# Patient Record
Sex: Male | Born: 1960 | Race: White | Hispanic: No | Marital: Married | Smoking: Never smoker
Health system: Southern US, Community
[De-identification: ages and names within clinical notes are randomized; demographics above are authoritative.]

## PROBLEM LIST (undated history)

## (undated) DIAGNOSIS — E039 Hypothyroidism, unspecified: Secondary | ICD-10-CM

## (undated) DIAGNOSIS — I10 Essential (primary) hypertension: Secondary | ICD-10-CM

## (undated) DIAGNOSIS — G4730 Sleep apnea, unspecified: Secondary | ICD-10-CM

## (undated) DIAGNOSIS — G4733 Obstructive sleep apnea (adult) (pediatric): Secondary | ICD-10-CM

## (undated) DIAGNOSIS — I471 Supraventricular tachycardia, unspecified: Secondary | ICD-10-CM

## (undated) DIAGNOSIS — E118 Type 2 diabetes mellitus with unspecified complications: Secondary | ICD-10-CM

## (undated) DIAGNOSIS — M109 Gout, unspecified: Secondary | ICD-10-CM

## (undated) DIAGNOSIS — E785 Hyperlipidemia, unspecified: Secondary | ICD-10-CM

## (undated) HISTORY — DX: Gout, unspecified: M10.9

## (undated) HISTORY — DX: Obstructive sleep apnea (adult) (pediatric): G47.33

## (undated) HISTORY — DX: Essential (primary) hypertension: I10

## (undated) HISTORY — DX: Hyperlipidemia, unspecified: E78.5

## (undated) HISTORY — DX: Sleep apnea, unspecified: G47.30

## (undated) HISTORY — DX: Hypothyroidism, unspecified: E03.9

## (undated) HISTORY — DX: Supraventricular tachycardia, unspecified: I47.10

## (undated) HISTORY — DX: Type 2 diabetes mellitus with unspecified complications: E11.8

## (undated) HISTORY — DX: Supraventricular tachycardia: I47.1

---
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
## 2007-02-27 ENCOUNTER — Encounter: Admission: RE | Admit: 2007-02-27 | Discharge: 2007-02-27 | Payer: Self-pay | Admitting: Otolaryngology

## 2017-04-22 ENCOUNTER — Other Ambulatory Visit: Payer: Self-pay | Admitting: Family Medicine

## 2017-04-22 DIAGNOSIS — R748 Abnormal levels of other serum enzymes: Secondary | ICD-10-CM

## 2017-04-28 ENCOUNTER — Ambulatory Visit
Admission: RE | Admit: 2017-04-28 | Discharge: 2017-04-28 | Disposition: A | Discharge status: Home / Self Care | Payer: BLUE CROSS/BLUE SHIELD | Source: Ambulatory Visit | Attending: Family Medicine | Admitting: Family Medicine

## 2017-04-28 DIAGNOSIS — R748 Abnormal levels of other serum enzymes: Secondary | ICD-10-CM

## 2017-04-29 ENCOUNTER — Other Ambulatory Visit: Payer: Self-pay | Admitting: Family Medicine

## 2017-04-29 DIAGNOSIS — R932 Abnormal findings on diagnostic imaging of liver and biliary tract: Secondary | ICD-10-CM

## 2017-04-29 DIAGNOSIS — R748 Abnormal levels of other serum enzymes: Secondary | ICD-10-CM

## 2017-05-06 ENCOUNTER — Ambulatory Visit
Admission: RE | Admit: 2017-05-06 | Discharge: 2017-05-06 | Disposition: A | Discharge status: Home / Self Care | Payer: BLUE CROSS/BLUE SHIELD | Source: Ambulatory Visit | Attending: Family Medicine | Admitting: Family Medicine

## 2017-05-06 DIAGNOSIS — R748 Abnormal levels of other serum enzymes: Secondary | ICD-10-CM

## 2017-05-06 DIAGNOSIS — R932 Abnormal findings on diagnostic imaging of liver and biliary tract: Secondary | ICD-10-CM

## 2017-05-06 MED ORDER — IOPAMIDOL (ISOVUE-300) INJECTION 61%
100.0000 mL | Freq: Once | INTRAVENOUS | Status: AC | PRN
  Administered 2017-05-06: 100 mL via INTRAVENOUS

## 2017-05-10 ENCOUNTER — Encounter: Payer: Self-pay | Admitting: Pulmonary Disease

## 2017-05-10 ENCOUNTER — Ambulatory Visit (INDEPENDENT_AMBULATORY_CARE_PROVIDER_SITE_OTHER)
Admission: RE | Admit: 2017-05-10 | Discharge: 2017-05-10 | Disposition: A | Discharge status: Home / Self Care | Payer: BLUE CROSS/BLUE SHIELD | Source: Ambulatory Visit | Attending: Pulmonary Disease | Admitting: Pulmonary Disease

## 2017-05-10 ENCOUNTER — Ambulatory Visit (INDEPENDENT_AMBULATORY_CARE_PROVIDER_SITE_OTHER): Payer: BLUE CROSS/BLUE SHIELD | Admitting: Pulmonary Disease

## 2017-05-10 VITALS — BP 122/78 | HR 66 | Ht 69.0 in | Wt 231.0 lb

## 2017-05-10 DIAGNOSIS — R0600 Dyspnea, unspecified: Secondary | ICD-10-CM

## 2017-05-10 LAB — POCT EXHALED NITRIC OXIDE: FeNO level (ppb): 17

## 2017-05-10 NOTE — Progress Notes (Signed)
Thomas Ballard    [id number]    05-25-1961  Primary Care Physician:Little, Caryn BeeKevin, MD  Referring Physician: Catha GosselinLittle, Kevin, MD [address] [place], [place] [address]  Chief complaint: Consult for evaluation of dyspnea  HPI: 56 year old with history of hypertension, supraventricular tachycardia, diabetes, hypercholesterolemia, sleep apnea, Nash.  Complains of dyspnea on exertion for the past year.  He describes a burning sensation, chest tightness when he exercises or plays tennis.  He denies any symptoms at rest, no wheeze, cough, sputum production, fevers, chills.  He had a cardiac evaluation done by Dr. Jacinto HalimGanji in December 2017 with echocardiogram showing EF 52%, mild concentric LVH.  Nuclear stress was low risk for ischemia. He also had an EKG at his primary care office which was reportedly normal.  He has had spirometry done at his primary care office and was told there were abnormal with mild obstruction.  I do not have these reports to review. He has history of paroxysmal supraventricular tachycardia since teen years.  He is on atenolol for this.  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  Lived in [place] for all his life.  No other significant travel.  Outpatient Encounter Prescriptions as of 05/10/2017  Medication Sig  . albuterol (PROVENTIL HFA;VENTOLIN HFA) 108 (90 Base) MCG/ACT inhaler Inhale 2 puffs into the lungs every 6 (six) hours as needed for wheezing or shortness of breath.  . allopurinol (ZYLOPRIM) 300 MG tablet Take 300 mg by mouth daily.  Marland Kitchen. amLODipine (NORVASC) 10 MG tablet Take 10 mg by mouth daily.  Marland Kitchen. aspirin EC 81 MG tablet Take 81 mg by mouth daily.  Marland Kitchen. atenolol (TENORMIN) 50 MG tablet Take 50 mg by mouth daily.  Marland Kitchen. atorvastatin (LIPITOR) 40 MG tablet Take 40 mg by mouth  daily.  . Canagliflozin-Metformin HCl (INVOKAMET) [phone] MG TABS Take 1 capsule by mouth 2 (two) times daily.  Marland Kitchen. glucosamine-chondroitin 500-400 MG tablet Take 2 tablets by mouth daily.  Marland Kitchen. levothyroxine (SYNTHROID, LEVOTHROID) 112 MCG tablet Take 112 mcg by mouth daily before breakfast.  . meloxicam (MOBIC) 7.5 MG tablet Take 7.5 mg by mouth daily.  . Multiple Vitamin (MULTIVITAMIN) capsule Take 2 capsules by mouth daily.  . Omega-3 Fatty Acids (FISH OIL) 1000 MG CAPS Take 2 capsules by mouth 2 (two) times daily.  . tizanidine (ZANAFLEX) 2 MG capsule Take 2 mg by mouth 3 (three) times daily.  . valsartan-hydrochlorothiazide (DIOVAN-HCT) 160-12.5 MG tablet Take 1 tablet by mouth daily.  . vitamin B-12 (CYANOCOBALAMIN) 1000 MCG tablet Take 5,000 mcg by mouth daily.   No facility-administered encounter medications on file as of 05/10/2017.     Allergies as of 05/10/2017 - Review Complete 05/10/2017  Allergen Reaction Noted  . Erythromycin Nausea And Vomiting 05/10/2017  . Lisinopril Other (See Comments) 05/10/2017    Past Medical History:  Diagnosis Date  . DM (diabetes mellitus), type 2 with complications (HCC)   . Gout   . Hyperlipidemia   . Hypertension   . Hypothyroidism   . OSA (obstructive sleep apnea)   . PSVT (paroxysmal supraventricular tachycardia) (HCC)   . Sleep apnea    Past Surgical History:  Procedure Laterality Date  . BACK SURGERY  1983    Family History  Problem Relation Age of Onset  . COPD Mother   .  Diabetes Mother   . Heart disease Father     Social History   Social History  . Marital status: Married    Spouse name: N/A  . Number of children: N/A  . Years of education: N/A   Occupational History  . Not on file.   Social History Main Topics  . Smoking status: Never Smoker  . Smokeless tobacco: Never Used  . Alcohol use 0.6 oz/week    1 Cans of beer per week     Comment: 1/week  . Drug use: No  . Sexual activity: No   Other Topics  Concern  . Not on file   Social History Narrative  . No narrative on file    Review of systems: Review of Systems  Constitutional: Negative for fever and chills.  HENT: Negative.   Eyes: Negative for blurred vision.  Respiratory: as per HPI  Cardiovascular: Negative for chest pain and palpitations.  Gastrointestinal: Negative for vomiting, diarrhea, blood per rectum. Genitourinary: Negative for dysuria, urgency, frequency and hematuria.  Musculoskeletal: Negative for myalgias, back pain and joint pain.  Skin: Negative for itching and rash.  Neurological: Negative for dizziness, tremors, focal weakness, seizures and loss of consciousness.  Endo/Heme/Allergies: Negative for environmental allergies.  Psychiatric/Behavioral: Negative for depression, suicidal ideas and hallucinations.  All other systems reviewed and are negative.  Physical Exam: Blood pressure 122/78, pulse 66, height 5\' 9"  (1.753 m), weight 231 lb (104.8 kg), SpO2 95 %. Gen:      No acute distress HEENT:  EOMI, sclera anicteric Neck:     No masses; no thyromegaly Lungs:    Clear to auscultation bilaterally; normal respiratory effort CV:         Regular rate and rhythm; no murmurs Abd:      + bowel sounds; soft, non-tender; no palpable masses, no distension Ext:    No edema; adequate peripheral perfusion Skin:      Warm and dry; no rash Neuro: alert and oriented x 3 Psych: normal mood and affect  Data Reviewed: FENO 05/10/17- 17  Assessment:  Consult for evaluation of dyspnea The etiology of his dyspnea is not clear.  There is no suspicion for COPD or asthma We will get a chest x-ray and PFTs for full evaluation Get records from Dr. Clarene Duke and Dr. Consuello Closs regarding his cardiac eval.  If the reason for his dyspnea still unclear then we will get a cardiopulmonary exercise test.  OSA On CPAP since January.  He is stable on therapy.  Plan/Recommendations: - Chest x-ray, PFTs - Obtain medical records  Chilton Greathouse MD [facility] Pager [phone] 05/10/2017, 3:45 PM  CC: Catha Gosselin, MD

## 2017-05-10 NOTE — Patient Instructions (Signed)
We will get chest x-ray and PFTs Return to clinic in 1 week for review and plan for further tests.

## 2017-05-10 NOTE — Progress Notes (Deleted)
         Joylene IgoGeorge Bowersox    [id number]    [birth year]  Primary Care Physician:Little, Caryn BeeKevin, MD  Referring Physician: Catha GosselinLittle, Kevin, MD [address] [place], [place] [address]  Chief complaint: Consult for evaluation of dyspnea  HPI:   Pets: Occupation: Exposures: Smoking history: Travel History:  No outpatient encounter prescriptions on file as of 05/10/2017.   No facility-administered encounter medications on file as of 05/10/2017.     Allergies as of 05/10/2017  . (Not on File)    No past medical history on file.  No past surgical history on file.  No family history on file.  Social History   Social History  . Marital status: Married    Spouse name: N/A  . Number of children: N/A  . Years of education: N/A   Occupational History  . Not on file.   Social History Main Topics  . Smoking status: Not on file  . Smokeless tobacco: Not on file  . Alcohol use Not on file  . Drug use: Unknown  . Sexual activity: Not on file   Other Topics Concern  . Not on file   Social History Narrative  . No narrative on file    Review of systems: Review of Systems  Constitutional: Negative for fever and chills.  HENT: Negative.   Eyes: Negative for blurred vision.  Respiratory: as per HPI  Cardiovascular: Negative for chest pain and palpitations.  Gastrointestinal: Negative for vomiting, diarrhea, blood per rectum. Genitourinary: Negative for dysuria, urgency, frequency and hematuria.  Musculoskeletal: Negative for myalgias, back pain and joint pain.  Skin: Negative for itching and rash.  Neurological: Negative for dizziness, tremors, focal weakness, seizures and loss of consciousness.  Endo/Heme/Allergies: Negative for environmental allergies.  Psychiatric/Behavioral: Negative for depression, suicidal ideas and hallucinations.  All other systems reviewed and are negative.  Physical Exam: There were no vitals taken for this visit. Gen:      No acute  distress HEENT:  EOMI, sclera anicteric Neck:     No masses; no thyromegaly Lungs:    Clear to auscultation bilaterally; normal respiratory effort*** CV:         Regular rate and rhythm; no murmurs Abd:      + bowel sounds; soft, non-tender; no palpable masses, no distension Ext:    No edema; adequate peripheral perfusion Skin:      Warm and dry; no rash Neuro: alert and oriented x 3 Psych: normal mood and affect  Data Reviewed: ***  Assessment:  ***  Plan/Recommendations: ***   Chilton GreathousePraveen Kimaria Struthers MD [facility] Pager [phone] 05/10/2017, 1:26 PM  CC: Catha GosselinLittle, Kevin, MD

## 2017-05-18 ENCOUNTER — Encounter: Payer: Self-pay | Admitting: Pulmonary Disease

## 2017-05-18 ENCOUNTER — Telehealth: Payer: Self-pay

## 2017-05-18 ENCOUNTER — Ambulatory Visit (INDEPENDENT_AMBULATORY_CARE_PROVIDER_SITE_OTHER): Payer: BLUE CROSS/BLUE SHIELD | Admitting: Pulmonary Disease

## 2017-05-18 VITALS — BP 130/78 | HR 69 | Ht 69.5 in | Wt 226.0 lb

## 2017-05-18 DIAGNOSIS — R0600 Dyspnea, unspecified: Secondary | ICD-10-CM | POA: Diagnosis not present

## 2017-05-18 LAB — PULMONARY FUNCTION TEST
DL/VA % pred: 113 %
DL/VA: 5.21 ml/min/mmHg/L
DLCO cor % pred: 107 %
DLCO cor: 33.99 ml/min/mmHg
DLCO unc % pred: 104 %
DLCO unc: 33.1 ml/min/mmHg
FEF 25-75 Post: 2.99 L/sec
FEF 25-75 Pre: 2.57 L/sec
FEF2575-%Change-Post: 16 %
FEF2575-%Pred-Post: 92 %
FEF2575-%Pred-Pre: 79 %
FEV1-%Change-Post: 4 %
FEV1-%Pred-Post: 89 %
FEV1-%Pred-Pre: 85 %
FEV1-Post: 3.36 L
FEV1-Pre: 3.2 L
FEV1FVC-%Change-Post: 0 %
FEV1FVC-%Pred-Pre: 98 %
FEV6-%Change-Post: 5 %
FEV6-%Pred-Post: 95 %
FEV6-%Pred-Pre: 90 %
FEV6-Post: 4.44 L
FEV6-Pre: 4.22 L
FEV6FVC-%Change-Post: 0 %
FEV6FVC-%Pred-Post: 104 %
FEV6FVC-%Pred-Pre: 104 %
FVC-%Change-Post: 4 %
FVC-%Pred-Post: 91 %
FVC-%Pred-Pre: 87 %
FVC-Post: 4.45 L
FVC-Pre: 4.23 L
Post FEV1/FVC ratio: 75 %
Post FEV6/FVC ratio: 100 %
Pre FEV1/FVC ratio: 76 %
Pre FEV6/FVC Ratio: 100 %

## 2017-05-18 NOTE — Progress Notes (Addendum)
Thomas Ballard    [id number]    07/04/1961  Primary Care Physician:Little, Caryn BeeKevin, MD  Referring Physician: Catha GosselinLittle, Kevin, MD [address] [place], [place] [address]  Chief complaint: Follow-up for dyspnea  HPI: 56 year old with history of hypertension, supraventricular tachycardia, diabetes, hypercholesterolemia, sleep apnea, Thomas Boone[place]ash.  Complains of dyspnea on exertion for the past year.  He describes a burning sensation, chest tightness when he exercises or plays tennis.  He denies any symptoms at rest, no wheeze, cough, sputum production, fevers, chills.  He had a cardiac evaluation done by Dr. Jacinto HalimGanji in December 2017 with echocardiogram showing EF 52%, mild concentric LVH.  Nuclear stress was low risk for ischemia. He also had an EKG at his primary care office which was reportedly normal.  He has had spirometry done at his primary care office and was told there were abnormal with mild obstruction.  I do not have these reports to review. He has history of paroxysmal supraventricular tachycardia since teen years.  He is on atenolol for this.   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 [place].  [place]ived in [place] for all his life.  No other significant travel.  Interim history: Respiratory symptoms are unchanged.  He continues to have mild dyspnea on exertion.  He is working on losing weight and reports that this is helping with his breathing.  Outpatient Encounter Medications as of 05/18/2017  Medication Sig  . albuterol (PROVENTIL HFA;VENTOLIN HFA) 108 (90 Base) MCG/ACT inhaler Inhale 2 puffs into the lungs every 6 (six) hours as needed for wheezing or shortness of breath.  . allopurinol (ZYLOPRIM) 300 MG tablet Take 300 mg by mouth daily.  Marland Kitchen. amLODipine (NORVASC) 10 MG tablet Take 10 mg by mouth  daily.  Marland Kitchen. aspirin EC 81 MG tablet Take 81 mg by mouth daily.  Marland Kitchen. atenolol (TENORMIN) 50 MG tablet Take 50 mg by mouth daily.  Marland Kitchen. atorvastatin (LIPITOR) 40 MG tablet Take 40 mg by mouth daily.  . Canagliflozin-Metformin HCl (INVOKAMET) 787 620 6363 MG TABS Take 1 capsule by mouth 2 (two) times daily.  Marland Kitchen. glucosamine-chondroitin 500-400 MG tablet Take 2 tablets by mouth daily.  Marland Kitchen. levothyroxine (SYNTHROID, LEVOTHROID) 112 MCG tablet Take 112 mcg by mouth daily before breakfast.  . Multiple Vitamin (MULTIVITAMIN) capsule Take 2 capsules by mouth daily.  . Omega-3 Fatty Acids (FISH OIL) 1000 MG CAPS Take 2 capsules by mouth 2 (two) times daily.  . valsartan-hydrochlorothiazide (DIOVAN-HCT) 160-12.5 MG tablet Take 1 tablet by mouth daily.  . vitamin B-12 (CYANOCOBALAMIN) 1000 MCG tablet Take 5,000 mcg by mouth daily.  . [DISCONTINUED] meloxicam (MOBIC) 7.5 MG tablet Take 7.5 mg by mouth daily.  . [DISCONTINUED] tizanidine (ZANAFLEX) 2 MG capsule Take 2 mg by mouth 3 (three) times daily.   No facility-administered encounter medications on file as of 05/18/2017.     Allergies as of 05/18/2017 - Review Complete 05/10/2017  Allergen Reaction Noted  . Erythromycin Nausea And Vomiting 05/10/2017  . Lisinopril Other (See Comments) 05/10/2017    Past Medical History:  Diagnosis Date  . DM (diabetes mellitus), type 2 with complications (HCC)   . Gout   . Hyperlipidemia   . Hypertension   . Hypothyroidism   . OSA (obstructive sleep apnea)   . PSVT (paroxysmal supraventricular tachycardia) (HCC)   .  Sleep apnea    Past Surgical History:  Procedure Laterality Date  . BACK SURGERY  1983    Family History  Problem Relation Age of Onset  . COPD Mother   . Diabetes Mother   . Heart disease Father     Social History   Socioeconomic History  . Marital status: Married    Spouse name: Not on file  . Number of children: Not on file  . Years of education: Not on file  . Highest education level: Not  on file  Social Needs  . Financial resource strain: Not on file  . Food insecurity - worry: Not on file  . Food insecurity - inability: Not on file  . Transportation needs - medical: Not on file  . Transportation needs - non-medical: Not on file  Occupational History  . Not on file  Tobacco Use  . Smoking status: Never Smoker  . Smokeless tobacco: Never Used  Substance and Sexual Activity  . Alcohol use: Yes    Alcohol/week: 0.6 oz    Types: 1 Cans of beer per week    Comment: 1/week  . Drug use: No  . Sexual activity: No  Other Topics Concern  . Not on file  Social History Narrative  . Not on file    Review of systems: Review of Systems  Constitutional: Negative for fever and chills.  HENT: Negative.   Eyes: Negative for blurred vision.  Respiratory: as per HPI  Cardiovascular: Negative for chest pain and palpitations.  Gastrointestinal: Negative for vomiting, diarrhea, blood per rectum. Genitourinary: Negative for dysuria, urgency, frequency and hematuria.  Musculoskeletal: Negative for myalgias, back pain and joint pain.  Skin: Negative for itching and rash.  Neurological: Negative for dizziness, tremors, focal weakness, seizures and loss of consciousness.  Endo/Heme/Allergies: Negative for environmental allergies.  Psychiatric/Behavioral: Negative for depression, suicidal ideas and hallucinations.  All other systems reviewed and are negative.  Physical Exam: Blood pressure 130/78, pulse 69, height 5' 9.5" (1.765 m), weight 226 lb (102.5 kg), SpO2 95 %. Gen:      No acute distress HEENT:  EOMI, sclera anicteric Neck:     No masses; no thyromegaly Lungs:    Clear to auscultation bilaterally; normal respiratory effort CV:         Regular rate and rhythm; no murmurs Abd:      + bowel sounds; soft, non-tender; no palpable masses, no distension Ext:    No edema; adequate peripheral perfusion Skin:      Warm and dry; no rash Neuro: alert and oriented x 3 Psych: normal  mood and affect  Data Reviewed: FENO 05/10/17- 17  Chest x-ray 05/10/17-no active cardiopulmonary disease.  CT abdomen 05/06/17-fatty liver, coronary artery calcification, lung images show no acute lung abnormality. I have reviewed the images personally  PFTs 05/18/17- FVC 4.45 [91%], FEV1 3.36 [89%], F/F 75, DLCO 104% Minimal obstruction shown by curvature of the flow loops.  Normal diffusion capacity.  Assessment:  Consult for evaluation of dyspnea The etiology of his dyspnea is not clear.  There is no suspicion for COPD or asthma Chest x-ray and CT abdomen reviewed.  There is no obvious lung abnormality PFTs show very minimal obstruction. I suspect his symptoms are secondary to deconditioning and body habitus.  He will continue to work on losing weight and diet, exercise Reevaluate in 6 months.  If there is any worsening of his symptoms then we can consider cardiopulmonary exercise test  OSA On CPAP since January.  He is stable on therapy.  Plan/Recommendations: - Chest x-ray, PFTs - Obtain medical records  Chilton GreathousePraveen Iyonnah Ferrante MD [facility] Pager [phone] 05/18/2017, 10:44 AM  CC: Catha GosselinLittle, Kevin, MD   Addendum: Received any notes from Dr. Jacinto HalimGanji [facility] visit 06/21/16 evaluation for atypical chest pain  Exercise stress test 06/14/16- low risk study Echocardiogram 06/15/16- LV cavity is normal size, mild concentric LVH.  Normal diastolic filling pattern.  Calculated EF 53% Left atrial cavity is mildly dilated, right atrial cavity is mildly dilated Right ventricle is mildly dilated with normal RV function.  Trace TR.  Unable to estimate PA pressure due to minimal TR  Advised diet, exercise and follow-up as needed.  Chilton GreathousePraveen Earlena Werst MD [facility] Pager [phone] If no answer or after 3pm call: [phone] 05/31/2017, 4:51 PM

## 2017-05-18 NOTE — Progress Notes (Signed)
PFT done today by Lindsay Lemons, CMA  

## 2017-05-18 NOTE — Telephone Encounter (Signed)
Received records from Dr. Fredirick MaudlinLittle's office. Records have been placed in PM's cubby for review.

## 2017-05-18 NOTE — Telephone Encounter (Signed)
Records release has been faxed to Dr. Jacinto HalimGanji and Dr. Fredirick MaudlinLittle's office requesting records. Will await records.

## 2017-05-18 NOTE — Patient Instructions (Signed)
I have reviewed your chest x-ray, CT abdomen and pulmonary function test.  There is no significant abnormality in your lungs.  Please continue to work on losing weight and exercise Continue using the CPAP Follow-up in 6 months.  Please call sooner if there is any change or worsening of your symptoms.

## 2017-05-30 NOTE — Telephone Encounter (Signed)
Records have been received from Dr. Jacinto HalimGanji and placed to PM's cubby for review. Nothing further needed.

## 2017-05-31 ENCOUNTER — Telehealth: Payer: Self-pay | Admitting: Pulmonary Disease

## 2017-05-31 NOTE — Telephone Encounter (Signed)
[facility] [employer] forward 43 pages to Dr. Chilton GreathousePraveen Mannam

## 2019-05-15 ENCOUNTER — Other Ambulatory Visit: Payer: Self-pay

## 2019-05-15 DIAGNOSIS — Z20822 Contact with and (suspected) exposure to covid-19: Secondary | ICD-10-CM

## 2019-05-17 LAB — NOVEL CORONAVIRUS, NAA: SARS-CoV-2, NAA: NOT DETECTED

## 2019-07-02 IMAGING — CT CT ABDOMEN WO/W CM
4 of 11 series · 15 of 36 positions shown, 19 images · IV contrast (iopamidol)
Comparison: None.

CLINICAL DATA: Elevated liver enzymes.

EXAM:
CT ABDOMEN WITHOUT AND WITH CONTRAST
TECHNIQUE: Multidetector CT imaging of the abdomen was performed following the
standard protocol before and following the bolus administration of
intravenous contrast.
CONTRAST:  100mL [id number]-[id number] IOPAMIDOL ([id number]) INJECTION 61%

[Series 6: arterial (id) · axial · arterial · 0.94mm/px · z∈[-226,-72]mm · 3 of 124 slices shown]
[im 31/124  soft-tissue]
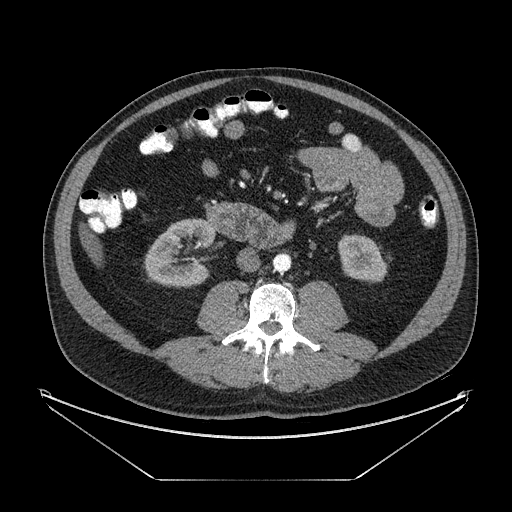
[im 62/124  soft-tissue]
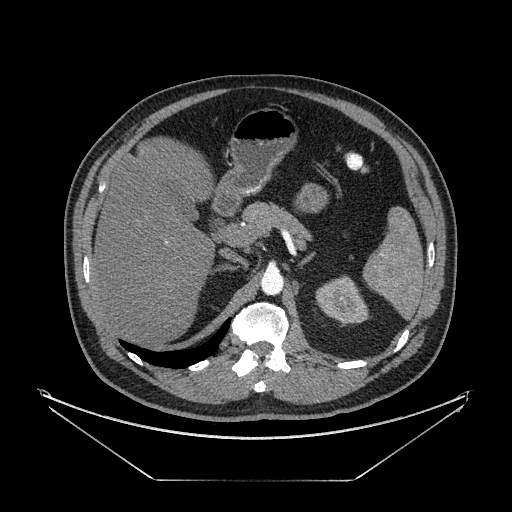
[im 93/124  soft-tissue]
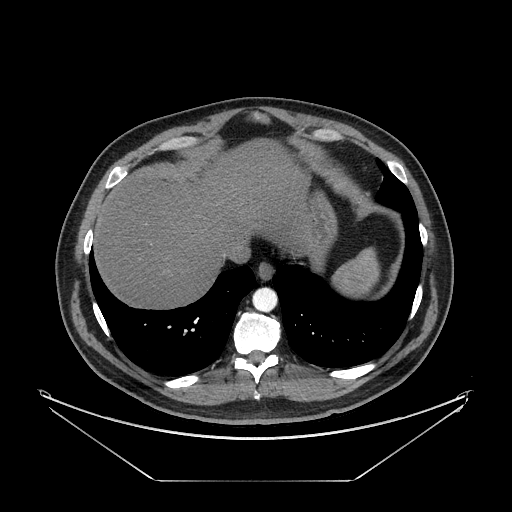

[Series 9: portal venous (id) · axial · portal-venous · 0.94mm/px · z∈[-226,-72]mm · 3 of 124 slices shown, 7 images]
[im 31/124  soft-tissue]
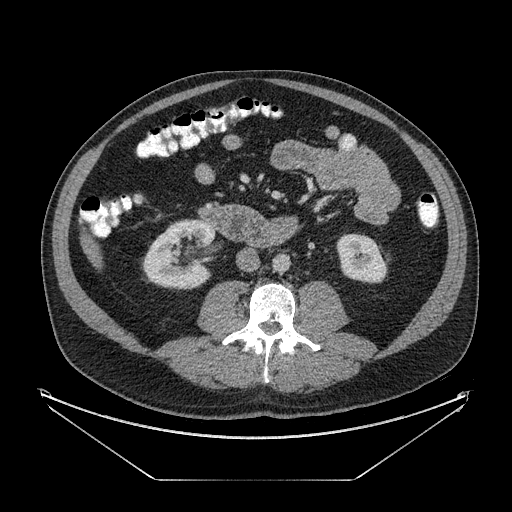
[im 31/124  lung]
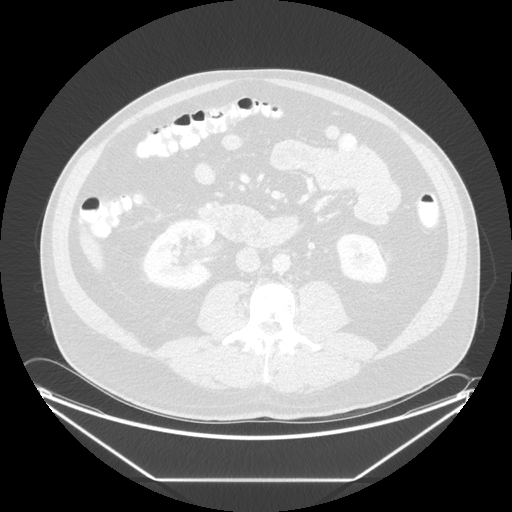
[im 31/124  bone]
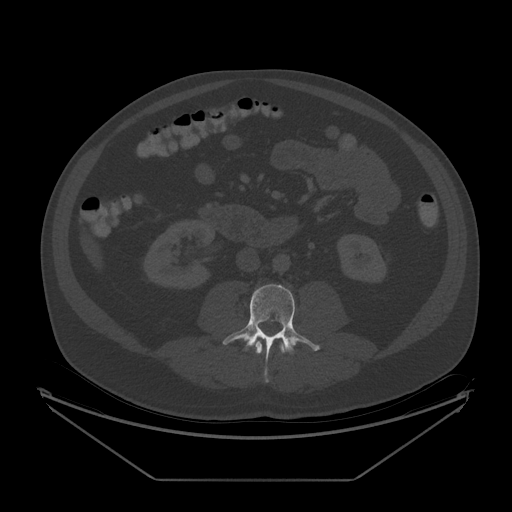
[im 62/124  soft-tissue]
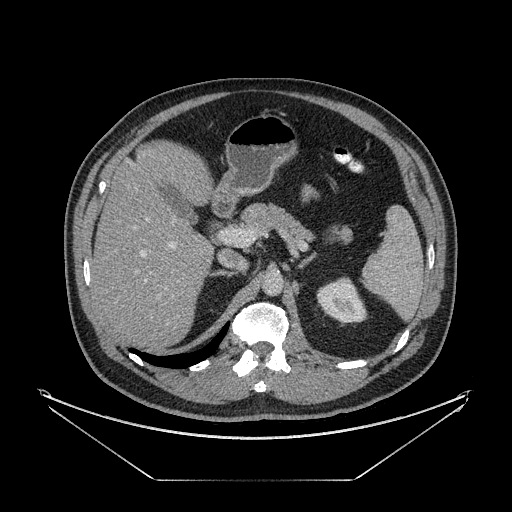
[im 62/124  lung]
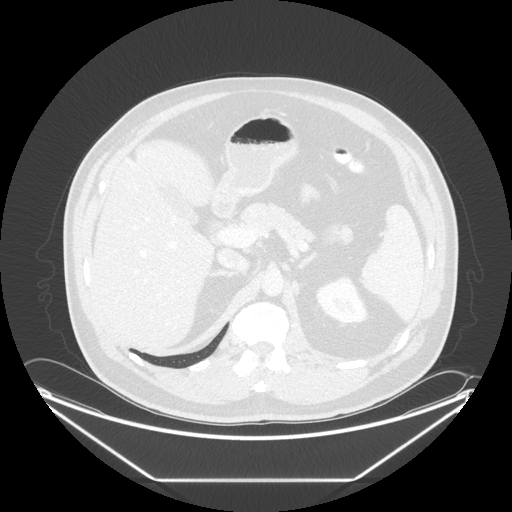
[im 93/124  soft-tissue]
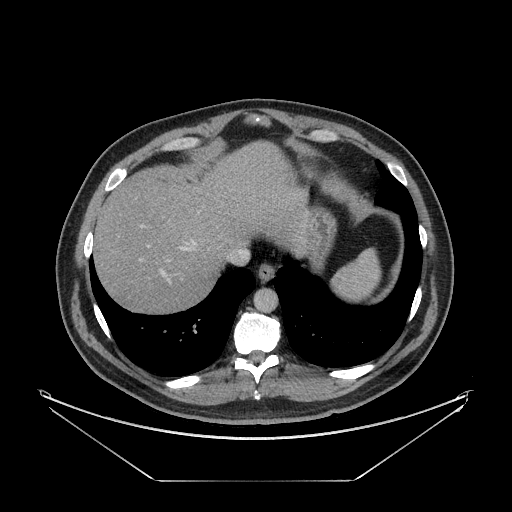
[im 93/124  lung]
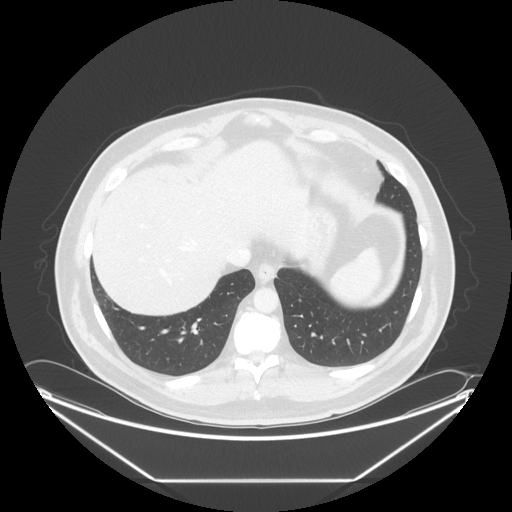

[Series 602: sagittal body · sagittal · 0.94mm/px · 5 of 168 slices shown (1 of 2)]
[im 28/168  soft-tissue]
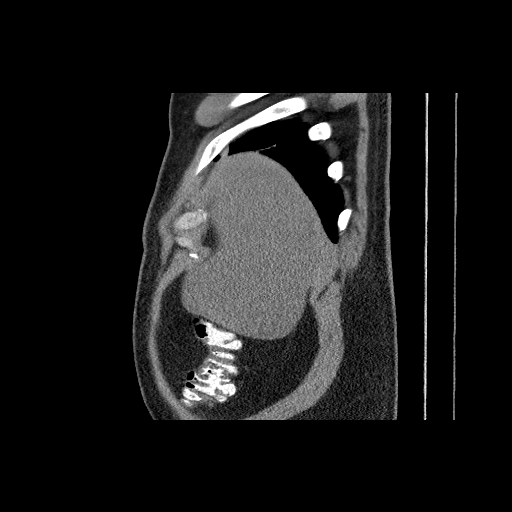
[im 56/168  soft-tissue]
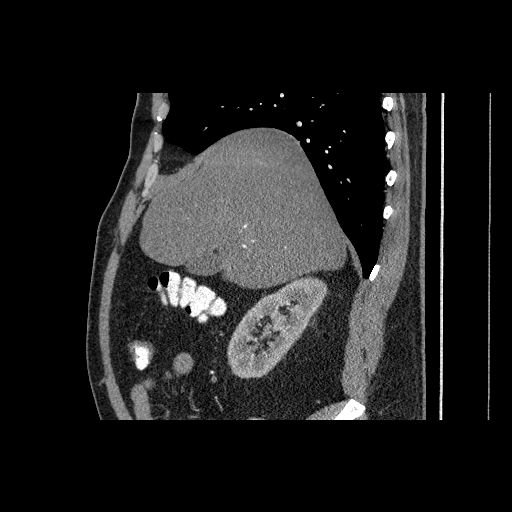
[im 84/168  soft-tissue]
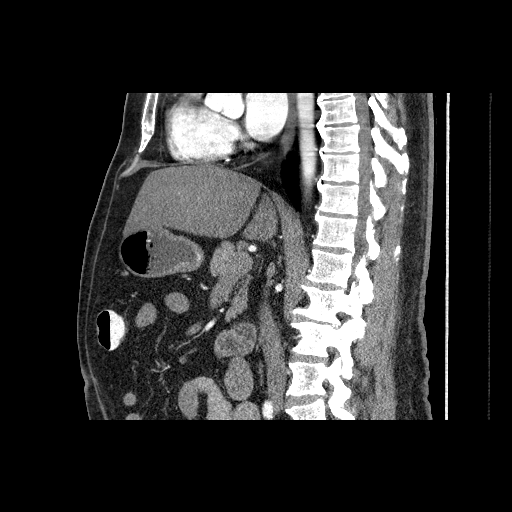
[im 112/168  soft-tissue]
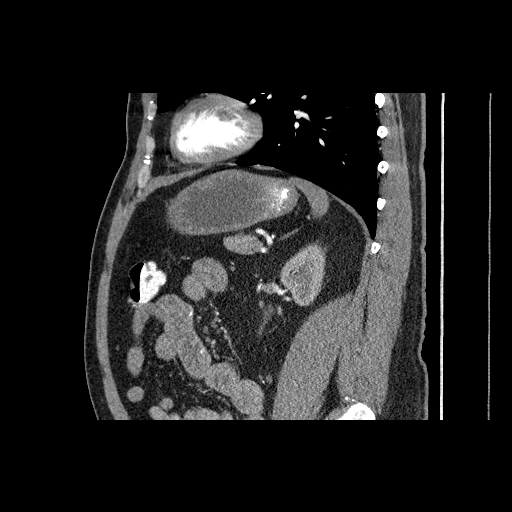
[im 140/168  soft-tissue]
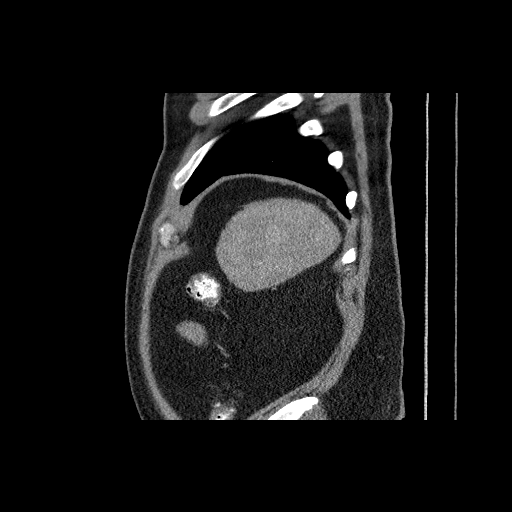

[Series 605: sagittal body · sagittal · 0.94mm/px · 4 of 169 slices shown (2 of 2)]
[im 29/169  soft-tissue]
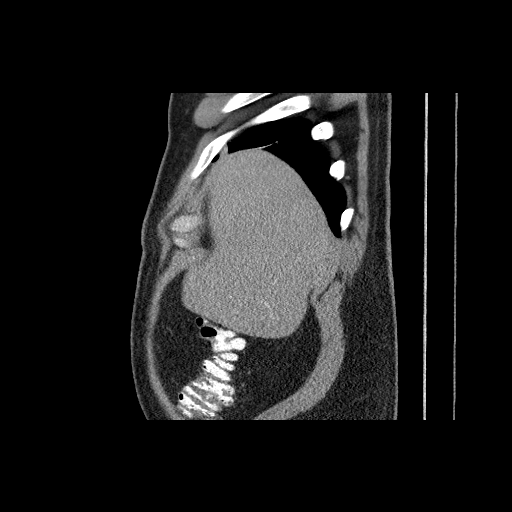
[im 57/169  soft-tissue]
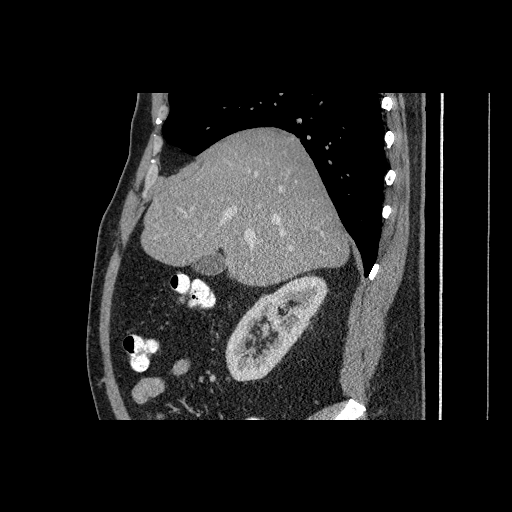
[im 85/169  soft-tissue]
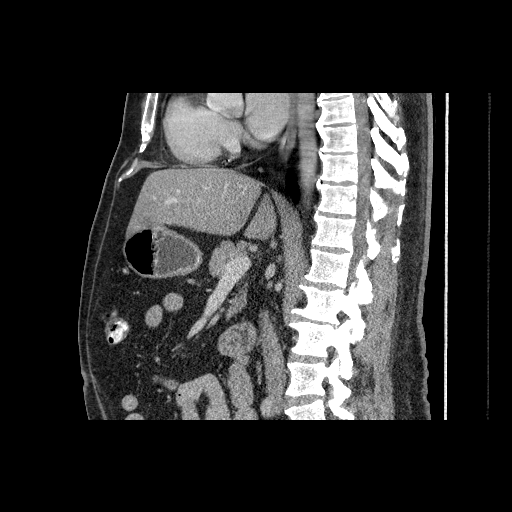
[im 113/169  soft-tissue]
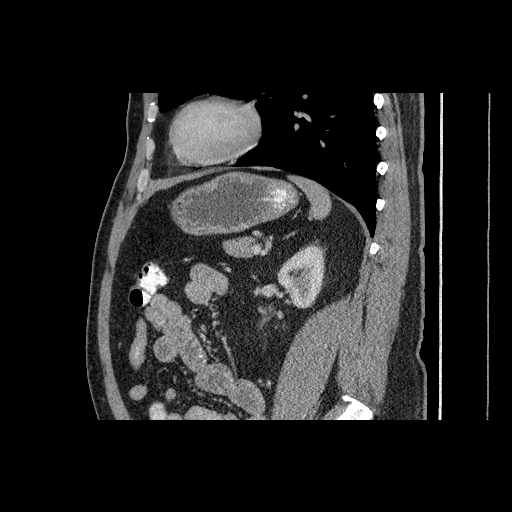

[15 of 36 positions shown; findings below may reference images not displayed]

FINDINGS: Lower chest: The lung bases are clear of acute process. No pleural
effusion or pulmonary lesions. The heart is normal in size. No
pericardial effusion. Age advanced three-vessel coronary artery
calcifications are noted. The distal esophagus and aorta are
unremarkable.

Hepatobiliary: Diffuse fatty infiltration of the liver but no focal
hepatic lesions or intrahepatic biliary dilatation. The portal and
hepatic veins are normal. The gallbladder is normal. No common bile
duct dilatation.

Pancreas: No mass, inflammation or ductal dilatation.

Spleen: Normal size.  No focal lesions.

Adrenals/Urinary Tract: The adrenal glands and kidneys are normal.

Stomach/Bowel: The stomach, duodenum, visualized small bowel and
visualize colon are unremarkable. The terminal ileum and visualized
appendix are normal.

Vascular/Lymphatic: Scattered aortic calcifications but no aneurysm
or dissection. The branch vessels are patent. The major venous
structures are patent.

No mesenteric or retroperitoneal mass or adenopathy.

Other:  No ascites or abdominal wall hernia.

Musculoskeletal: No significant bony findings. The bilateral SI
joint degenerative changes are noted.
IMPRESSION: 1. Diffuse fatty infiltration of the liver but no focal hepatic
lesions or biliary dilatation.
2. No acute abdominal findings, mass lesions or adenopathy.
3. Significant age advanced three-vessel coronary artery
calcifications.

## 2019-07-06 IMAGING — DX DG CHEST 2V
2 series · 2 of 2 positions shown · non-contrast
Comparison: None in PACs

CLINICAL DATA: Two months of shortness of breath. History of
diabetes, hypertension, tachycardia.

EXAM:
CHEST  2 VIEW

[chest pa]
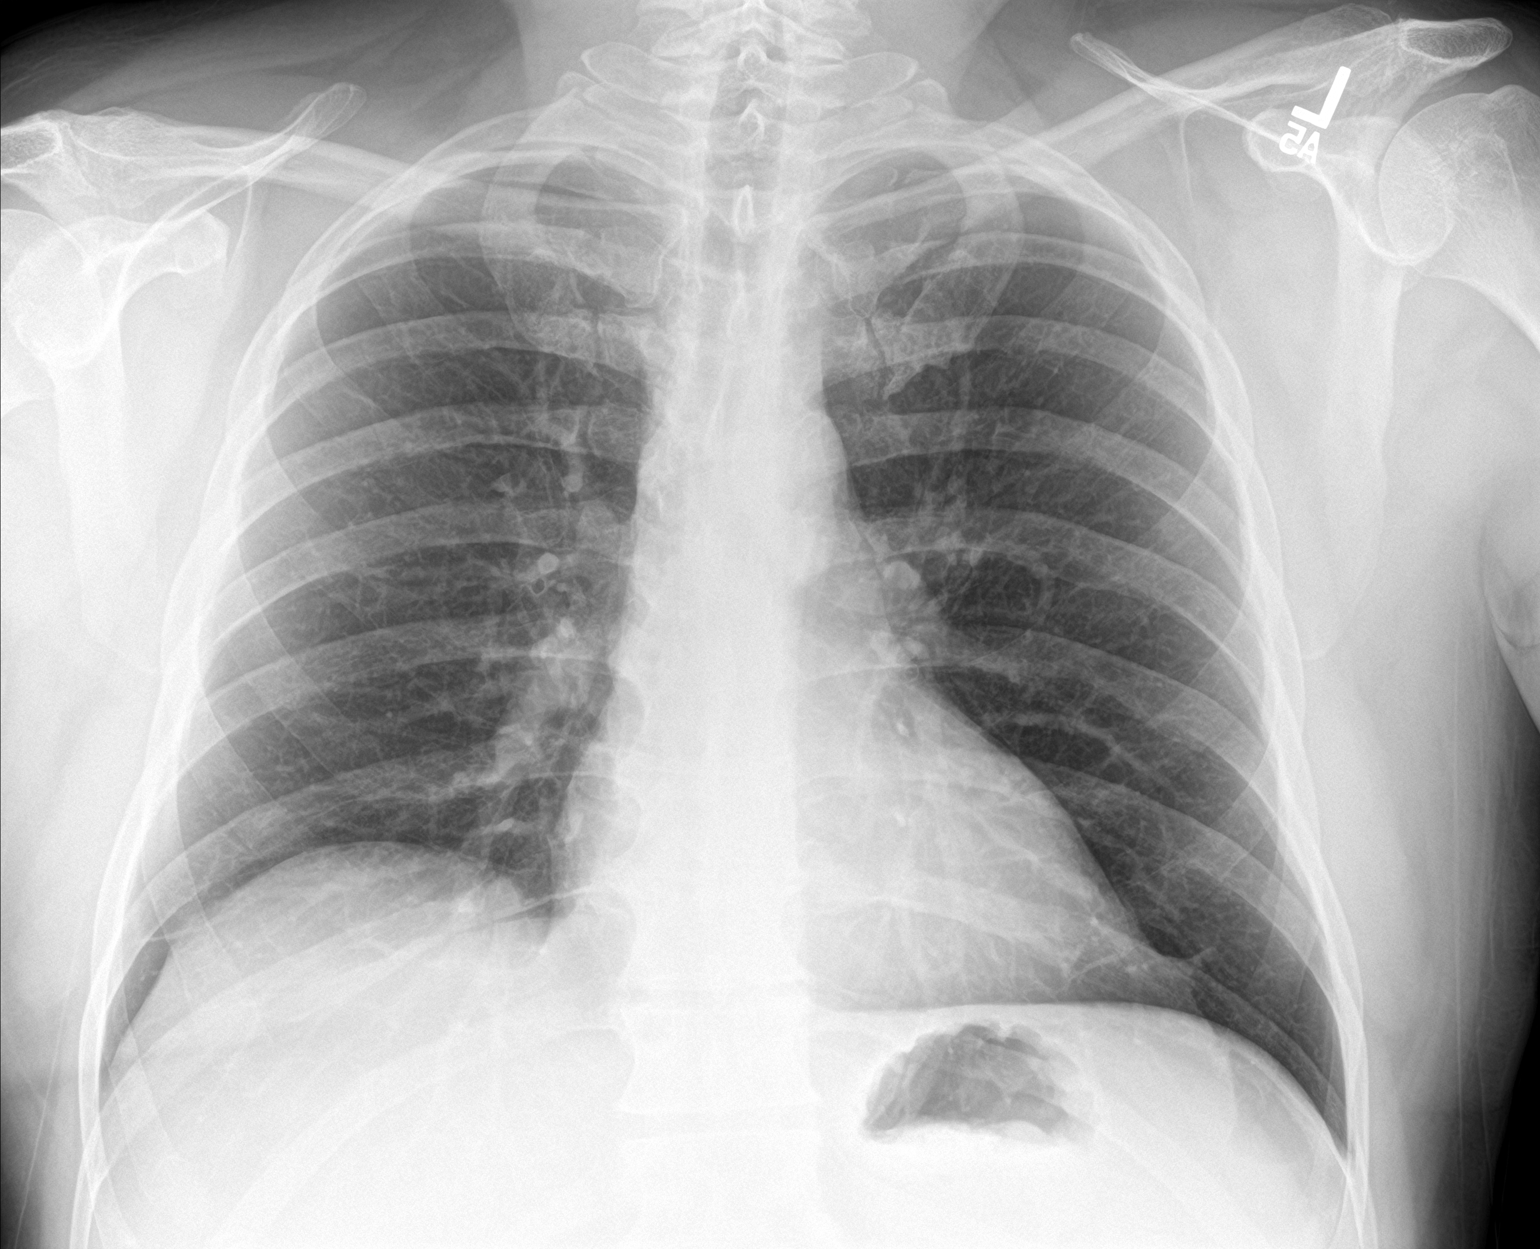

[chest lat]
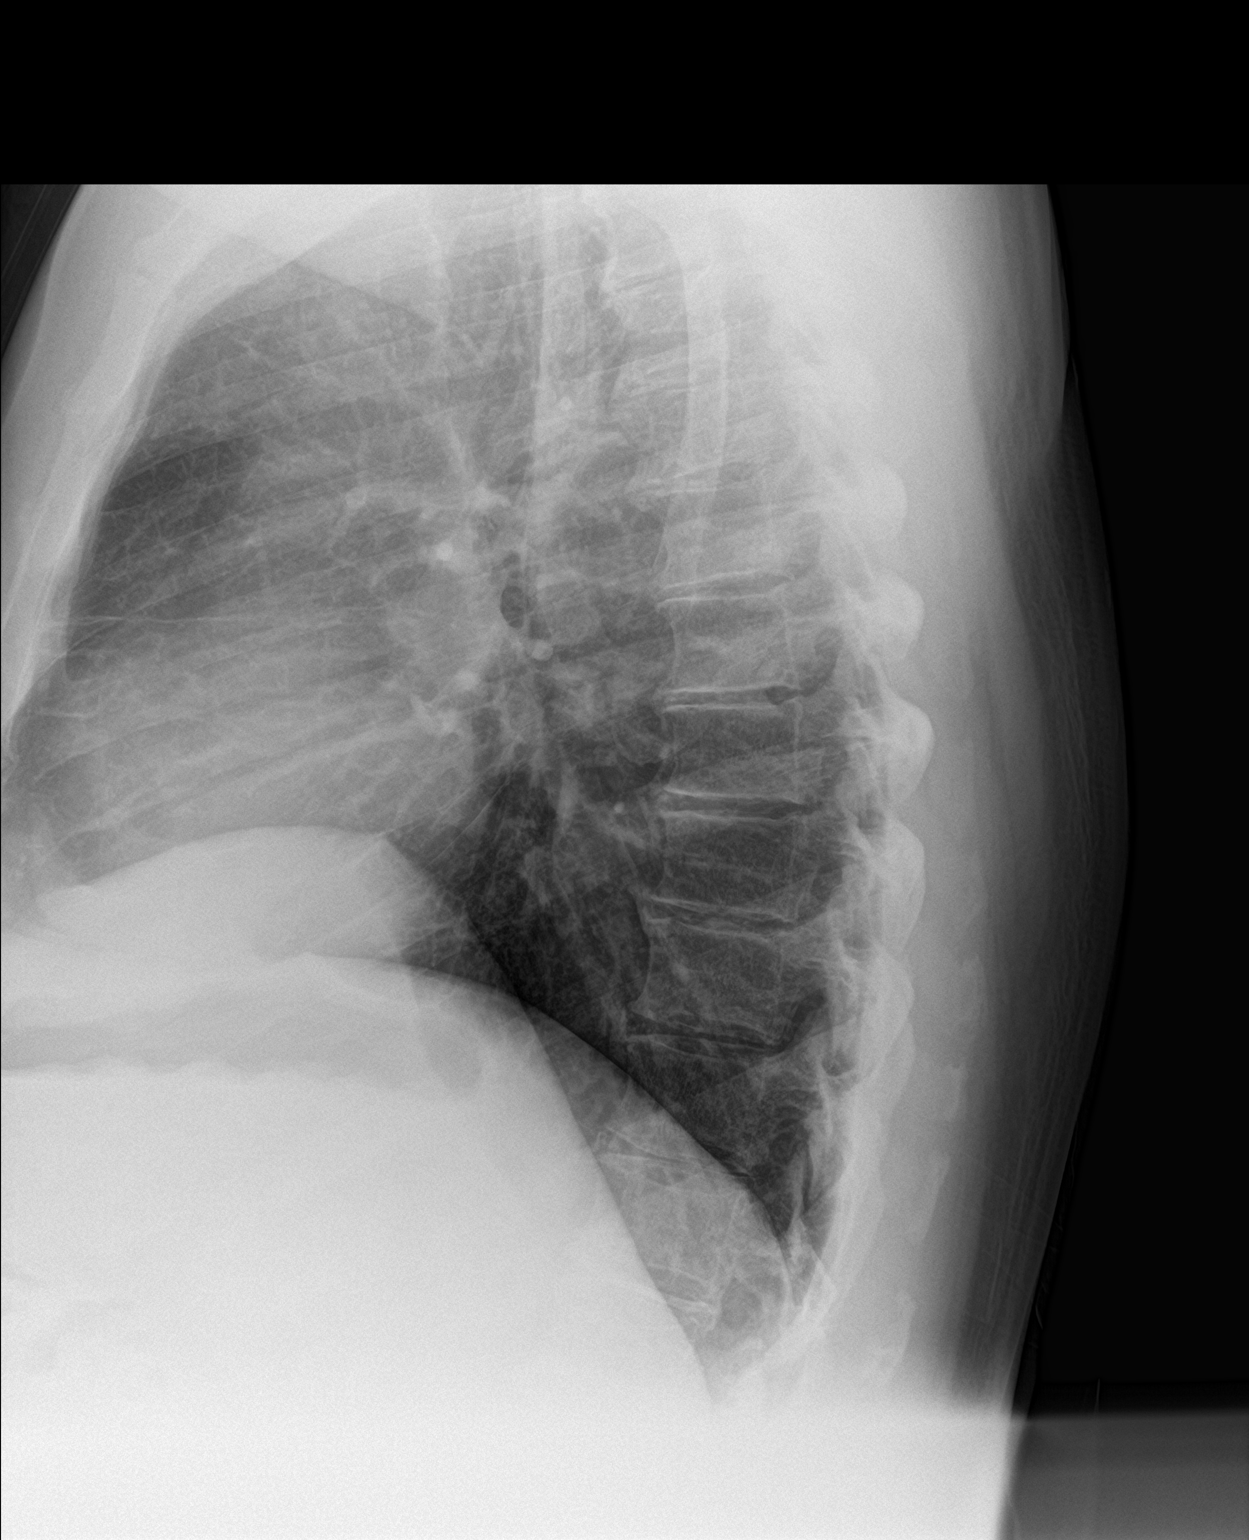

[2 of 2 positions shown; findings below may reference images not displayed]

FINDINGS: The lungs are adequately inflated and clear. The heart and pulmonary
vascularity are normal. The mediastinum is normal in width. There is
no pleural effusion. The bony thorax exhibits no acute abnormality.
IMPRESSION: There is no active cardiopulmonary disease.

## 2019-09-28 ENCOUNTER — Ambulatory Visit: Payer: Self-pay | Attending: Internal Medicine

## 2019-09-28 DIAGNOSIS — Z23 Encounter for immunization: Secondary | ICD-10-CM

## 2019-09-28 NOTE — Progress Notes (Signed)
   Covid-19 Vaccination Clinic  Name:  Thomas Ballard    MRN: [id number] DOB: 02-24-1961  09/28/2019  [name] was observed post Covid-19 immunization for 15 minutes without incident. He was provided with Vaccine Information Sheet and instruction to access the V-Safe system.   [name] was instructed to call 911 with any severe reactions post vaccine: Marland Kitchen Difficulty breathing  . Swelling of face and throat  . A fast heartbeat  . A bad rash all over body  . Dizziness and weakness   Immunizations Administered    Name Date Dose VIS Date Route   Pfizer COVID-19 Vaccine 09/28/2019 10:28 AM 0.3 mL 06/22/2019 Intramuscular   Manufacturer: [employer], [employer]   Lot: [id number]   NDC: [address]-6

## 2019-10-24 ENCOUNTER — Ambulatory Visit: Payer: Self-pay | Attending: Internal Medicine

## 2019-10-24 DIAGNOSIS — Z23 Encounter for immunization: Secondary | ICD-10-CM

## 2019-10-24 NOTE — Progress Notes (Signed)
   Covid-19 Vaccination Clinic  Name:  Thomas Ballard    MRN: [id number] DOB: 1960-09-29  10/24/2019  [name] was observed post Covid-19 immunization for 15 minutes without incident. He was provided with Vaccine Information Sheet and instruction to access the V-Safe system.   [name] was instructed to call 911 with any severe reactions post vaccine: Marland Kitchen Difficulty breathing  . Swelling of face and throat  . A fast heartbeat  . A bad rash all over body  . Dizziness and weakness   Immunizations Administered    Name Date Dose VIS Date Route   Pfizer COVID-19 Vaccine 10/24/2019  8:22 AM 0.3 mL 06/22/2019 Intramuscular   Manufacturer: [employer], [employer]   Lot: [id number]   NDC: [address]-0

## 2021-07-16 DIAGNOSIS — L812 Freckles: Secondary | ICD-10-CM | POA: Diagnosis not present

## 2021-07-16 DIAGNOSIS — D225 Melanocytic nevi of trunk: Secondary | ICD-10-CM | POA: Diagnosis not present

## 2021-07-16 DIAGNOSIS — L819 Disorder of pigmentation, unspecified: Secondary | ICD-10-CM | POA: Diagnosis not present

## 2021-07-16 DIAGNOSIS — L570 Actinic keratosis: Secondary | ICD-10-CM | POA: Diagnosis not present

## 2021-07-16 DIAGNOSIS — L814 Other melanin hyperpigmentation: Secondary | ICD-10-CM | POA: Diagnosis not present

## 2021-07-16 DIAGNOSIS — L821 Other seborrheic keratosis: Secondary | ICD-10-CM | POA: Diagnosis not present

## 2021-07-16 DIAGNOSIS — L853 Xerosis cutis: Secondary | ICD-10-CM | POA: Diagnosis not present

## 2021-08-14 DIAGNOSIS — Z0000 Encounter for general adult medical examination without abnormal findings: Secondary | ICD-10-CM | POA: Diagnosis not present

## 2021-08-14 DIAGNOSIS — K760 Fatty (change of) liver, not elsewhere classified: Secondary | ICD-10-CM | POA: Diagnosis not present

## 2021-08-14 DIAGNOSIS — H9312 Tinnitus, left ear: Secondary | ICD-10-CM | POA: Diagnosis not present

## 2021-08-14 DIAGNOSIS — E039 Hypothyroidism, unspecified: Secondary | ICD-10-CM | POA: Diagnosis not present

## 2021-08-14 DIAGNOSIS — E782 Mixed hyperlipidemia: Secondary | ICD-10-CM | POA: Diagnosis not present

## 2021-08-14 DIAGNOSIS — I10 Essential (primary) hypertension: Secondary | ICD-10-CM | POA: Diagnosis not present

## 2021-08-14 DIAGNOSIS — E119 Type 2 diabetes mellitus without complications: Secondary | ICD-10-CM | POA: Diagnosis not present

## 2021-08-14 DIAGNOSIS — Z125 Encounter for screening for malignant neoplasm of prostate: Secondary | ICD-10-CM | POA: Diagnosis not present

## 2021-08-14 DIAGNOSIS — M109 Gout, unspecified: Secondary | ICD-10-CM | POA: Diagnosis not present

## 2021-11-02 DIAGNOSIS — G4733 Obstructive sleep apnea (adult) (pediatric): Secondary | ICD-10-CM | POA: Diagnosis not present

## 2021-12-21 DIAGNOSIS — E1165 Type 2 diabetes mellitus with hyperglycemia: Secondary | ICD-10-CM | POA: Diagnosis not present

## 2021-12-21 DIAGNOSIS — E039 Hypothyroidism, unspecified: Secondary | ICD-10-CM | POA: Diagnosis not present

## 2021-12-21 DIAGNOSIS — E7800 Pure hypercholesterolemia, unspecified: Secondary | ICD-10-CM | POA: Diagnosis not present

## 2021-12-29 DIAGNOSIS — E1165 Type 2 diabetes mellitus with hyperglycemia: Secondary | ICD-10-CM | POA: Diagnosis not present

## 2021-12-29 DIAGNOSIS — E7800 Pure hypercholesterolemia, unspecified: Secondary | ICD-10-CM | POA: Diagnosis not present

## 2021-12-29 DIAGNOSIS — I10 Essential (primary) hypertension: Secondary | ICD-10-CM | POA: Diagnosis not present

## 2021-12-29 DIAGNOSIS — G629 Polyneuropathy, unspecified: Secondary | ICD-10-CM | POA: Diagnosis not present

## 2021-12-29 DIAGNOSIS — E039 Hypothyroidism, unspecified: Secondary | ICD-10-CM | POA: Diagnosis not present

## 2021-12-29 DIAGNOSIS — E559 Vitamin D deficiency, unspecified: Secondary | ICD-10-CM | POA: Diagnosis not present

## 2023-06-23 ENCOUNTER — Ambulatory Visit: Payer: 59 | Admitting: Podiatry

## 2023-06-23 ENCOUNTER — Ambulatory Visit (INDEPENDENT_AMBULATORY_CARE_PROVIDER_SITE_OTHER): Payer: 59

## 2023-06-23 DIAGNOSIS — M79671 Pain in right foot: Secondary | ICD-10-CM

## 2023-06-23 DIAGNOSIS — G629 Polyneuropathy, unspecified: Secondary | ICD-10-CM

## 2023-06-23 DIAGNOSIS — M7662 Achilles tendinitis, left leg: Secondary | ICD-10-CM | POA: Diagnosis not present

## 2023-06-23 DIAGNOSIS — M79672 Pain in left foot: Secondary | ICD-10-CM | POA: Diagnosis not present

## 2023-06-23 NOTE — Patient Instructions (Signed)
VISIT SUMMARY:  During today's visit, we discussed several ongoing health concerns, including sensations in your feet, tenderness in your left Achilles tendon, and occasional instability in your right foot. We also reviewed your history of back surgery and diabetes management. We have developed a plan to address each of these issues and maintain your overall health.  YOUR PLAN:  -PERIPHERAL NEUROPATHY: Peripheral neuropathy is a condition where the nerves in your extremities, like your feet, are damaged, often causing sensations like 'bubble wrap.' This can be due to diabetes or past back surgery. We recommend trying the [facility] supplement for symptom relief, maintaining good blood sugar control, and performing regular foot checks to prevent unnoticed injuries or infections.  -ACHILLES TENDINITIS: Achilles tendinitis is inflammation of the Achilles tendon, causing tenderness and swelling. We suggest a home therapy plan with stretching exercises twice daily for a month, continuing Advil for inflammation, and avoiding high-intensity activities. If there's no improvement in a month, we may refer you to physical therapy or consider using a boot.  -FUNCTIONAL ANKLE INSTABILITY: Functional ankle instability is when the ankle feels like it might give way, often due to muscle weakness. We will continue with the Achilles therapy plan, which may also help improve ankle stability. If symptoms worsen or become more consistent, further evaluation may be needed.  -GENERAL HEALTH MAINTENANCE: To manage your chronic back issues, continue using good chair and car seat ergonomics. Maintain good blood sugar control for diabetes management and perform regular foot checks to prevent unnoticed injuries or infections.  INSTRUCTIONS:  Please follow the home therapy plan for your Achilles tendinitis, including stretching exercises twice daily and taking Advil as needed. Avoid high-intensity activities for a few more weeks.  If there is no improvement in a month, we will consider referring you to physical therapy or using a boot. Continue with regular foot checks and maintain good blood sugar control. If you experience worsening symptoms or new issues, please schedule a follow-up appointment.    Achilles Tendinitis  with Rehab Achilles tendinitis is a disorder of the Achilles tendon. The Achilles tendon connects the large calf muscles (Gastrocnemius and Soleus) to the heel bone (calcaneus). This tendon is sometimes called the heel cord. It is important for pushing-off and standing on your toes and is important for walking, running, or jumping. Tendinitis is often caused by overuse and repetitive microtrauma. SYMPTOMS Pain, tenderness, swelling, warmth, and redness may occur over the Achilles tendon even at rest. Pain with pushing off, or flexing or extending the ankle. Pain that is worsened after or during activity. CAUSES  Overuse sometimes seen with rapid increase in exercise programs or in sports requiring running and jumping. Poor physical conditioning (strength and flexibility or endurance). Running sports, especially training running down hills. Inadequate warm-up before practice or play or failure to stretch before participation. Injury to the tendon. PREVENTION  Warm up and stretch before practice or competition. Allow time for adequate rest and recovery between practices and competition. Keep up conditioning. Keep up ankle and leg flexibility. Improve or keep muscle strength and endurance. Improve cardiovascular fitness. Use proper technique. Use proper equipment (shoes, skates). To help prevent recurrence, taping, protective strapping, or an adhesive bandage may be recommended for several weeks after healing is complete. PROGNOSIS  Recovery may take weeks to several months to heal. Longer recovery is expected if symptoms have been prolonged. Recovery is usually quicker if the inflammation is due  to a direct blow as compared with overuse or sudden strain. RELATED  COMPLICATIONS  Healing time will be prolonged if the condition is not correctly treated. The injury must be given plenty of time to heal. Symptoms can reoccur if activity is resumed too soon. Untreated, tendinitis may increase the risk of tendon rupture requiring additional time for recovery and possibly surgery. TREATMENT  The first treatment consists of rest anti-inflammatory medication, and ice to relieve the pain. Stretching and strengthening exercises after resolution of pain will likely help reduce the risk of recurrence. Referral to a physical therapist or athletic trainer for further evaluation and treatment may be helpful. A walking boot or cast may be recommended to rest the Achilles tendon. This can help break the cycle of inflammation and microtrauma. Arch supports (orthotics) may be prescribed or recommended by your caregiver as an adjunct to therapy and rest. Surgery to remove the inflamed tendon lining or degenerated tendon tissue is rarely necessary and has shown less than predictable results. MEDICATION  Nonsteroidal anti-inflammatory medications, such as aspirin and ibuprofen, may be used for pain and inflammation relief. Do not take within 7 days before surgery. Take these as directed by your caregiver. Contact your caregiver immediately if any bleeding, stomach upset, or signs of allergic reaction occur. Other minor pain relievers, such as acetaminophen, may also be used. Pain relievers may be prescribed as necessary by your caregiver. Do not take prescription pain medication for longer than 4 to 7 days. Use only as directed and only as much as you need. Cortisone injections are rarely indicated. Cortisone injections may weaken tendons and predispose to rupture. It is better to give the condition more time to heal than to use them. HEAT AND COLD Cold is used to relieve pain and reduce inflammation for acute and  chronic Achilles tendinitis. Cold should be applied for 10 to 15 minutes every 2 to 3 hours for inflammation and pain and immediately after any activity that aggravates your symptoms. Use ice packs or an ice massage. Heat may be used before performing stretching and strengthening activities prescribed by your caregiver. Use a heat pack or a warm soak. SEEK MEDICAL CARE IF: Symptoms get worse or do not improve in 2 weeks despite treatment. New, unexplained symptoms develop. Drugs used in treatment may produce side effects.  EXERCISES:  RANGE OF MOTION (ROM) AND STRETCHING EXERCISES - Achilles Tendinitis  These exercises may help you when beginning to rehabilitate your injury. Your symptoms may resolve with or without further involvement from your physician, physical therapist or athletic trainer. While completing these exercises, remember:  Restoring tissue flexibility helps normal motion to return to the joints. This allows healthier, less painful movement and activity. An effective stretch should be held for at least 30 seconds. A stretch should never be painful. You should only feel a gentle lengthening or release in the stretched tissue.  STRETCH  Gastroc, Standing  Place hands on wall. Extend right / left leg, keeping the front knee somewhat bent. Slightly point your toes inward on your back foot. Keeping your right / left heel on the floor and your knee straight, shift your weight toward the wall, not allowing your back to arch. You should feel a gentle stretch in the right / left calf. Hold this position for 10 seconds. Repeat 3 times. Complete this stretch 2 times per day.  STRETCH  Soleus, Standing  Place hands on wall. Extend right / left leg, keeping the other knee somewhat bent. Slightly point your toes inward on your back foot. Keep your right / left  heel on the floor, bend your back knee, and slightly shift your weight over the back leg so that you feel a gentle stretch deep in  your back calf. Hold this position for 10 seconds. Repeat 3 times. Complete this stretch 2 times per day.  STRETCH  Gastrocsoleus, Standing  Note: This exercise can place a lot of stress on your foot and ankle. Please complete this exercise only if specifically instructed by your caregiver.  Place the ball of your right / left foot on a step, keeping your other foot firmly on the same step. Hold on to the wall or a rail for balance. Slowly lift your other foot, allowing your body weight to press your heel down over the edge of the step. You should feel a stretch in your right / left calf. Hold this position for 10 seconds. Repeat this exercise with a slight bend in your knee. Repeat 3 times. Complete this stretch 2 times per day.   STRENGTHENING EXERCISES - Achilles Tendinitis These exercises may help you when beginning to rehabilitate your injury. They may resolve your symptoms with or without further involvement from your physician, physical therapist or athletic trainer. While completing these exercises, remember:  Muscles can gain both the endurance and the strength needed for everyday activities through controlled exercises. Complete these exercises as instructed by your physician, physical therapist or athletic trainer. Progress the resistance and repetitions only as guided. You may experience muscle soreness or fatigue, but the pain or discomfort you are trying to eliminate should never worsen during these exercises. If this pain does worsen, stop and make certain you are following the directions exactly. If the pain is still present after adjustments, discontinue the exercise until you can discuss the trouble with your clinician.  STRENGTH - Plantar-flexors  Sit with your right / left leg extended. Holding onto both ends of a rubber exercise band/tubing, loop it around the ball of your foot. Keep a slight tension in the band. Slowly push your toes away from you, pointing them  downward. Hold this position for 10 seconds. Return slowly, controlling the tension in the band/tubing. Repeat 3 times. Complete this exercise 2 times per day.   STRENGTH - Plantar-flexors  Stand with your feet shoulder width apart. Steady yourself with a wall or table using as little support as needed. Keeping your weight evenly spread over the width of your feet, rise up on your toes.* Hold this position for 10 seconds. Repeat 3 times. Complete this exercise 2 times per day.  *If this is too easy, shift your weight toward your right / left leg until you feel challenged. Ultimately, you may be asked to do this exercise with your right / left foot only.  STRENGTH  Plantar-flexors, Eccentric  Note: This exercise can place a lot of stress on your foot and ankle. Please complete this exercise only if specifically instructed by your caregiver.  Place the balls of your feet on a step. With your hands, use only enough support from a wall or rail to keep your balance. Keep your knees straight and rise up on your toes. Slowly shift your weight entirely to your right / left toes and pick up your opposite foot. Gently and with controlled movement, lower your weight through your right / left foot so that your heel drops below the level of the step. You will feel a slight stretch in the back of your calf at the end position. Use the healthy leg to help rise  up onto the balls of both feet, then lower weight only on the right / left leg again. Build up to 15 repetitions. Then progress to 3 consecutive sets of 15 repetitions.* After completing the above exercise, complete the same exercise with a slight knee bend (about 30 degrees). Again, build up to 15 repetitions. Then progress to 3 consecutive sets of 15 repetitions.* Perform this exercise 2 times per day.  *When you easily complete 3 sets of 15, your physician, physical therapist or athletic trainer may advise you to add resistance by wearing a backpack  filled with additional weight.  STRENGTH - Plantar Flexors, Seated  Sit on a chair that allows your feet to rest flat on the ground. If necessary, sit at the edge of the chair. Keeping your toes firmly on the ground, lift your right / left heel as far as you can without increasing any discomfort in your ankle. Repeat 3 times. Complete this exercise 2 times a day.

## 2023-06-23 NOTE — Progress Notes (Signed)
Subjective:  Patient ID: Thomas Ballard, male    DOB: 08-30-1960,  MRN: [id number]  No chief complaint on file.   Discussed the use of AI scribe software for clinical note transcription with the patient, who gave verbal consent to proceed.  History of Present Illness   The patient, with a history of diabetes and previous back surgery, presents with a long-standing sensation in the left foot, described as 'bubble wrap,' which has now extended to the right foot. This sensation has been present for approximately five to six years. The patient also reports a feeling of 'gout' in the right big toe, without the severe pain typically associated with gout. The patient experiences intermittent 'weird' feelings when dragging the left foot on bed sheets, which do not persist. The patient is concerned about the progression of these symptoms and is seeking ways to prevent further deterioration.  The patient is active, engaging in tennis and golf, but has been experiencing tenderness in the left Achilles tendon, which prompted a recent doctor's visit. The patient reports no pain, but a noticeable sensation when pressure is applied to the area. The patient also reports a sensation of the right foot 'giving away' during golf, particularly at the driving range, with minor associated pain.  The patient has a history of a ruptured disc in the back, which was surgically treated 25 years ago. Since then, the patient has experienced lower back and leg discomfort, particularly after prolonged sitting during travel and meetings. An MRI revealed narrowing in the back, and the patient has been managing this with physical therapy and ergonomic adjustments. The patient also has a broad-based rupture in the neck, causing occasional soreness and pain radiating down the left arm.  The patient's diabetes is well-controlled, with a stable A1c and blood sugars that are slightly high in the morning but lower post-meal. The  patient's diabetes doctor suspects the foot sensations may be due to neuropathy, but the patient believes it may be related to the back issues. The patient has been proactive in managing his health, including using high-quality shoes and arch supports to manage foot discomfort.          Objective:    Physical Exam   MUSCULOSKELETAL: Mild tenderness and edema in the midsubstance of the Achilles tendon without sharp pain as well as gastrocnemius equinus. No gross ankle instability. Altered sensations in the forefoot, ball of the foot and toes.       No images are attached to the encounter.    Results   LABS A1c: 6 Blood glucose: 105, 120-145, 140, 200  RADIOLOGY  X-ray feet: small plantar and posterior calcaneal spurring, Haglund deformity, no acute fracture or stress fracture (06/23/2023)      Assessment:   1. Polyneuropathy   2. Achilles tendinitis, left leg      Plan:  Patient was evaluated and treated and all questions answered.  Assessment and Plan    Peripheral Neuropathy   He reports a chronic sensation akin to "bubble wrap" in his left foot, now also present in the right foot, likely multifactorial due to a history of back surgery and well-controlled diabetes, without severe pain or functional impairment. We will consider the [employer] supplement for potential symptomatic relief, continue good glycemic control to prevent progression, and conduct regular foot checks to prevent unnoticed injury and infection. Do not think he needs gabapentin or lyrica at this point as it is not particularly painful.  Achilles Tendinitis   He exhibits mild  tenderness and edema in the midsubstance of the Achilles tendon on the left side, without sharp pain or gross ankle instability. We will initiate a home therapy plan focusing on stretching exercises twice daily for a month, continue Advil for its anti-inflammatory effect, restrict high-intensity activities for a few more weeks, and if  there is no improvement in a month, consider referral to physical therapy or use of a boot.  Functional Ankle Instability   He experiences an occasional sensation of "giving away" in the right foot, possibly due to muscle weakness around the ankle, without consistent pain or history of severe ankle injuries. We will continue with the Achilles therapy plan, which may also improve ankle stability, and if symptoms worsen or become more consistent, consider further evaluation.  General Health Maintenance   We will continue good chair and car seat ergonomics to manage chronic back issues, maintain good glycemic control for diabetes management, and conduct regular foot checks to prevent unnoticed injury and infection.          Return if symptoms worsen or fail to improve.

## 2023-08-05 ENCOUNTER — Telehealth: Payer: Self-pay

## 2023-08-05 NOTE — Telephone Encounter (Signed)
Patient called and left a message. He has been doing the exercises we gave him. His left Achilles tendon feels like it is getting worse with the exercises - one of which is lifting the heel with full weight on it - he cannot do it anymore. He called for an appointment and was told it would be mid February  He is requesting that you call him yourself for a quick discussion of what to do next. Last note says possibly a boot, professional PT - please call at your convenience [phone] thanks

## 2023-08-08 ENCOUNTER — Telehealth (INDEPENDENT_AMBULATORY_CARE_PROVIDER_SITE_OTHER): Payer: 59 | Admitting: Podiatry

## 2023-08-08 DIAGNOSIS — M62462 Contracture of muscle, left lower leg: Secondary | ICD-10-CM

## 2023-08-08 DIAGNOSIS — M7662 Achilles tendinitis, left leg: Secondary | ICD-10-CM

## 2023-08-08 MED ORDER — MELOXICAM 15 MG PO TABS: 15.0000 mg | ORAL_TABLET | Freq: Every day | ORAL | 3 refills | Status: DC

## 2023-08-08 NOTE — Telephone Encounter (Signed)
Spoke with patient by telephone this morning, reviewed his progress he has not improved despite doing his home physical therapy plan, utilizing ice and OTC NSAIDs.  We discussed progressing his treatment to include complete rest in a cam walker boot which she will purchase on [employer], I recommended formal physical therapy to see if additional modalities can help to treat his injury nonoperatively and referral was sent to [facility] for physical therapy.  MRI has also been ordered to evaluate the tendon for any possible partial tearing and guide further treatment options.  I will see him back in 6 weeks in the office.  Approximately 10 minutes was spent in this discussion of his ongoing issue, evaluating his current symptoms and upcoming plans, and management of the issue with the ordering of medications referrals and testing.

## 2023-08-09 ENCOUNTER — Other Ambulatory Visit: Payer: Self-pay

## 2023-08-09 ENCOUNTER — Ambulatory Visit: Payer: 59 | Attending: Podiatry

## 2023-08-09 DIAGNOSIS — M6281 Muscle weakness (generalized): Secondary | ICD-10-CM | POA: Insufficient documentation

## 2023-08-09 DIAGNOSIS — M25572 Pain in left ankle and joints of left foot: Secondary | ICD-10-CM | POA: Insufficient documentation

## 2023-08-09 DIAGNOSIS — R262 Difficulty in walking, not elsewhere classified: Secondary | ICD-10-CM | POA: Insufficient documentation

## 2023-08-09 DIAGNOSIS — M62462 Contracture of muscle, left lower leg: Secondary | ICD-10-CM | POA: Insufficient documentation

## 2023-08-09 DIAGNOSIS — R252 Cramp and spasm: Secondary | ICD-10-CM | POA: Diagnosis present

## 2023-08-09 NOTE — Therapy (Signed)
OUTPATIENT PHYSICAL THERAPY LOWER EXTREMITY EVALUATION   Patient Name: Thomas Ballard MRN: [id number] DOB:May 06, 1961, 63 y.o., male Today's Date: 08/09/2023  END OF SESSION:  PT End of Session - 08/09/23 1229     Visit Number 1    Date for PT Re-Evaluation 10/04/23    Authorization Type U[employer]    Progress Note Due on Visit 10    PT Start Time 1230    PT Stop Time 1320    PT Time Calculation (min) 50 min    Activity Tolerance Patient tolerated treatment well    Behavior During Therapy WFL for tasks assessed/performed             Past Medical History:  Diagnosis Date   DM (diabetes mellitus), type 2 with complications (HCC)    Gout    Hyperlipidemia    Hypertension    Hypothyroidism    OSA (obstructive sleep apnea)    PSVT (paroxysmal supraventricular tachycardia) (HCC)    Sleep apnea    Past Surgical History:  Procedure Laterality Date   BACK SURGERY  1983   There are no active problems to display for this patient.   PCP: Peri Maris, FNP  REFERRING PROVIDER: Sharl Ma, DPM  REFERRING DIAG:  [phone] (ICD-10-CM) - Gastrocnemius equinus of left lower extremity  THERAPY DIAG:  Pain in left ankle and joints of left foot  Cramp and spasm  Difficulty in walking, not elsewhere classified  Muscle weakness (generalized)  Rationale for Evaluation and Treatment: Rehabilitation  ONSET DATE: 08/08/2023  SUBJECTIVE:   SUBJECTIVE STATEMENT: Pt present to PT with chronic history of Lt foot and ankle pain.  He plays tennis and golf quite frequently and competes in tennis tournaments.  He has put all of this on hold per MD suggestion.  He was prescribed rest and HEP by MD in December 2024 without response.  MD has ordered PT at this time.  He has left rocker boot that he ordered from [employer], also on MD suggestion.  He hopes to be able to recover fully and return to golf and tennis.     From MD note:  The patient, with a history of diabetes and previous back  surgery, presents with a long-standing sensation in the left foot, described as 'bubble wrap,' which has now extended to the right foot. This sensation has been present for approximately five to six years. The patient also reports a feeling of 'gout' in the right big toe, without the severe pain typically associated with gout. The patient experiences intermittent 'weird' feelings when dragging the left foot on bed sheets, which do not persist. The patient is concerned about the progression of these symptoms and is seeking ways to prevent further deterioration.   PERTINENT HISTORY: na  PAIN: 08/09/23 Are you having pain? Yes: NPRS scale: 0/10 at rest and 1/10 when walking, 1-2 hours of tennis 5-6/10 Pain location: left achilles Pain description: aching with activity, interfering with active lifestyle Aggravating factors: tennis, excessive walking (has to fly for work and having to walk rapidly through airport) Relieving factors: Rest, ice  PRECAUTIONS: None  RED FLAGS: None   WEIGHT BEARING RESTRICTIONS: No  FALLS:  Has patient fallen in last 6 months? No  LIVING ENVIRONMENT: Lives with: lives with their spouse Lives in: House/apartment  OCCUPATION: Sales rep, mostly desk, some traveling  PLOF: Independent, Independent with basic ADLs, Independent with household mobility without device, Independent with community mobility without device, Independent with homemaking with ambulation, Independent with gait,  and Independent with transfers  PATIENT GOALS: He hopes to be able to recover fully and return to golf and tennis.   NEXT MD VISIT: na  OBJECTIVE:  Note: Objective measures were completed at Evaluation unless otherwise noted.  DIAGNOSTIC FINDINGS: MRI ordered for Lt foot/ankle  PATIENT SURVEYS:  LEFS complete next visit  COGNITION: Overall cognitive status: Within functional limits for tasks assessed     SENSATION: WFL   POSTURE:  left foot pronated >  right  PALPATION: Non tender and medial and lateral stabilizers, minimal tenderness over left achilles  LOWER EXTREMITY ROM:  Active ROM Right eval Left eval  Hip flexion    Hip extension    Hip abduction    Hip adduction    Hip internal rotation    Hip external rotation    Knee flexion    Knee extension    Ankle dorsiflexion 18 14  Ankle plantarflexion 52 42  Ankle inversion 20 18  Ankle eversion 12 22   (Blank rows = not tested)  LOWER EXTREMITY MMT:  All generally 5/5 with exception of left ankle eversion 4+/5  LOWER EXTREMITY SPECIAL TESTS:  Ankle special tests: Thompson's test: negative  FUNCTIONAL TESTS:  5 times sit to stand: complete next visit Timed up and go (TUG): Complete next visit  GAIT: Distance walked: 30 feet Assistive device utilized: None Level of assistance: Complete Independence Comments: antalgic                                                                                                                                TREATMENT DATE:  08/09/23 HEP established- see below     PATIENT EDUCATION:  Education details: Initiated HEP Person educated: Patient Education method: Programmer, multimedia, Facilities manager, and Handouts Education comprehension: verbalized understanding and returned demonstration  HOME EXERCISE PROGRAM: Patient had HEP issued by MD.  This was reviewed and updated.    ASSESSMENT:  CLINICAL IMPRESSION: Patient is a 63 y.o. male who was seen today for physical therapy evaluation and treatment for Lt foot and ankle pain.  His initial injury was while playing tennis.  This has become chronic and he has significantly reduced his activity level.  He presents with slight left foot pronation, decreased ROM and strength of the left ankle and antalgic gait.  He would benefit from skilled PT for left ankle ROM, strengthening and stability training along with DN and modalities for pain relief.    OBJECTIVE IMPAIRMENTS: difficulty walking,  decreased ROM, decreased strength, increased fascial restrictions, increased muscle spasms, impaired flexibility, postural dysfunction, and pain.   ACTIVITY LIMITATIONS: squatting, stairs, and transfers  PARTICIPATION LIMITATIONS: cleaning, driving, shopping, community activity, occupation, and yard work  PERSONAL FACTORS: Profession and Time since onset of injury/illness/exacerbation are also affecting patient's functional outcome.   REHAB POTENTIAL: Good  CLINICAL DECISION MAKING: Stable/uncomplicated  EVALUATION COMPLEXITY: Low   GOALS: Goals reviewed with patient? Yes  SHORT TERM GOALS: Target date: 09/06/2023  Be independent in initial HEP Baseline: Goal status: INITIAL  2.  Be able to discontinue boot Baseline:  Goal status: INITIAL  LONG TERM GOALS: Target date: 10/04/2023   Be independent in advanced HEP Baseline:  Goal status: INITIAL  2.  Improve LEFS by 3-5 points  Baseline:  Goal status: INITIAL  3.  Be able to resume tennis and golf Baseline:  Goal status: INITIAL  4.  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  Be able to do single leg heel raise on left x 5 Baseline:  Goal status: INITIAL    PLAN:  PT FREQUENCY: 2x/week  PT DURATION: 8 weeks  PLANNED INTERVENTIONS: 97110-Therapeutic exercises, 97530- Therapeutic activity, [id number]- Neuromuscular re-education, 97535- Self Care, 16109- Manual therapy, [id number]- Gait training, [phone]- Canalith repositioning, [id number]- Aquatic Therapy, 97014- Electrical stimulation (unattended), [id number]- Electrical stimulation (manual), 97016- Vasopneumatic device, [id number]- Ionotophoresis 4mg /ml Dexamethasone, Patient/Family education, Balance training, Stair training, Taping, Dry Needling, Joint mobilization, Cryotherapy, Moist heat, and Biofeedback  PLAN FOR NEXT SESSION: Complete functional tests and LEFS.  review HEP, DN to Lt gastroc, manual to Lt achilles and  associated soft tissue, proprioception   Lorrene Reid, PT 08/09/23 6:47 PM   [facility] [address], [address] [place], [place] [address] Phone # [phone] Fax [phone]

## 2023-08-09 NOTE — Telephone Encounter (Signed)
Pt called back and is scheduled for 3/20 at 815am

## 2023-08-16 ENCOUNTER — Encounter: Payer: Self-pay | Admitting: Rehabilitative and Restorative Service Providers"

## 2023-08-16 ENCOUNTER — Ambulatory Visit: Payer: 59 | Attending: Podiatry | Admitting: Rehabilitative and Restorative Service Providers"

## 2023-08-16 DIAGNOSIS — M25572 Pain in left ankle and joints of left foot: Secondary | ICD-10-CM | POA: Diagnosis present

## 2023-08-16 DIAGNOSIS — R252 Cramp and spasm: Secondary | ICD-10-CM | POA: Insufficient documentation

## 2023-08-16 DIAGNOSIS — R262 Difficulty in walking, not elsewhere classified: Secondary | ICD-10-CM | POA: Diagnosis present

## 2023-08-16 DIAGNOSIS — R293 Abnormal posture: Secondary | ICD-10-CM | POA: Diagnosis present

## 2023-08-16 DIAGNOSIS — M6281 Muscle weakness (generalized): Secondary | ICD-10-CM | POA: Diagnosis present

## 2023-08-16 NOTE — Therapy (Signed)
 OUTPATIENT PHYSICAL THERAPY TREATMENT NOTE   Patient Name: Thomas Ballard MRN: [id number] DOB:24-Jan-1961, 63 y.o., male Today's Date: 08/16/2023  END OF SESSION:  PT End of Session - 08/16/23 0758     Visit Number 2    Date for PT Re-Evaluation 10/04/23    Authorization Type UHC    Progress Note Due on Visit 10    PT Start Time 0756    PT Stop Time 0840    PT Time Calculation (min) 44 min    Activity Tolerance Patient tolerated treatment well    Behavior During Therapy WFL for tasks assessed/performed             Past Medical History:  Diagnosis Date   DM (diabetes mellitus), type 2 with complications (HCC)    Gout    Hyperlipidemia    Hypertension    Hypothyroidism    OSA (obstructive sleep apnea)    PSVT (paroxysmal supraventricular tachycardia) (HCC)    Sleep apnea    Past Surgical History:  Procedure Laterality Date   BACK SURGERY  1983   There are no active problems to display for this patient.   PCP: Marvene Barter, FNP  REFERRING PROVIDER: Silva Cornet, DPM  REFERRING DIAG:  [phone] (ICD-10-CM) - Gastrocnemius equinus of left lower extremity  THERAPY DIAG:  Pain in left ankle and joints of left foot  Cramp and spasm  Difficulty in walking, not elsewhere classified  Muscle weakness (generalized)  Rationale for Evaluation and Treatment: Rehabilitation  ONSET DATE: 08/08/2023  SUBJECTIVE:   SUBJECTIVE STATEMENT: Pt states that he has been doing his exercises that the MD gave him.  From MD note:  The patient, with a history of diabetes and previous back surgery, presents with a long-standing sensation in the left foot, described as 'bubble wrap,' which has now extended to the right foot. This sensation has been present for approximately five to six years. The patient also reports a feeling of 'gout' in the right big toe, without the severe pain typically associated with gout. The patient experiences intermittent 'weird' feelings when  dragging the left foot on bed sheets, which do not persist. The patient is concerned about the progression of these symptoms and is seeking ways to prevent further deterioration.   PERTINENT HISTORY: na  PAIN:  Are you having pain? Yes: NPRS scale: currently 0-1/10 Pain location: left achilles Pain description: tightness Aggravating factors: tennis, excessive walking (has to fly for work and having to walk rapidly through airport) Relieving factors: Rest, ice  PRECAUTIONS: None  RED FLAGS: None   WEIGHT BEARING RESTRICTIONS: No  FALLS:  Has patient fallen in last 6 months? No  LIVING ENVIRONMENT: Lives with: lives with their spouse Lives in: House/apartment  OCCUPATION: Sales rep, mostly desk, some traveling  PLOF: Independent, Independent with basic ADLs, Independent with household mobility without device, Independent with community mobility without device, Independent with homemaking with ambulation, Independent with gait, and Independent with transfers  PATIENT GOALS: He hopes to be able to recover fully and return to golf and tennis.   NEXT MD VISIT: na  OBJECTIVE:  Note: Objective measures were completed at Evaluation unless otherwise noted.  DIAGNOSTIC FINDINGS: MRI ordered for Lt foot/ankle  PATIENT SURVEYS:  08/16/2023:  LEFS  56 / 80 = 70.0 %  COGNITION: Overall cognitive status: Within functional limits for tasks assessed     SENSATION: WFL   POSTURE:  left foot pronated > right  PALPATION: Non tender and medial and lateral  stabilizers, minimal tenderness over left achilles  LOWER EXTREMITY ROM:  Active ROM Right eval Left eval  Hip flexion    Hip extension    Hip abduction    Hip adduction    Hip internal rotation    Hip external rotation    Knee flexion    Knee extension    Ankle dorsiflexion 18 14  Ankle plantarflexion 52 42  Ankle inversion 20 18  Ankle eversion 12 22   (Blank rows = not tested)  LOWER EXTREMITY MMT:  All  generally 5/5 with exception of left ankle eversion 4+/5  LOWER EXTREMITY SPECIAL TESTS:  Ankle special tests: Thompson's test: negative  FUNCTIONAL TESTS:  08/16/2023 5 times sit to stand: 11.22 sec Timed up and go (TUG): 7.41 sec Single leg stance:  right- 21.54 sec, left- 4.31 sec  GAIT: Distance walked: 30 feet Assistive device utilized: None Level of assistance: Complete Independence Comments: antalgic                                                                                                                                TODAY'S TREATMENT   DATE: 08/16/2023 Nustep level 3 x6 min with PT present to discuss status Lower Extremity Functional Score: 56 / 80 = 70.0 % 5 times sit to stand, TUG, Single Leg Stance Standing calf stretch on slanted rocker board 2x20 sec Seated left ankle alphabet A-Z left foot Seated left foot towel scrunches x20 Seated heel/toe raises x20 Seated left ankle BAPS board for CW and CCW circles x10 each Seated ankle circles for CW and CCW x10 Manual:  soft tissue mobilization to left lower leg and plantar surface of foot to promote improved tissue mobility.  Patient with manual trigger point release to areas of trigger points, more specifically left anterior tib.   DATE: 08/09/23 HEP established- see below     PATIENT EDUCATION:  Education details: Initiated HEP Person educated: Patient Education method: Programmer, Multimedia, Facilities Manager, and Handouts Education comprehension: verbalized understanding and returned demonstration  HOME EXERCISE PROGRAM: Access Code: [email or web address] URL: [email or web address] Date: 08/16/2023 Prepared by: Jarrell Zoran Yankee  Exercises - Gastroc Stretch on Wall  - 1 x daily - 7 x weekly - 2 reps - 30 sec hold - Soleus Stretch on Wall  - 1 x daily - 7 x weekly - 2 reps - 30 sec hold - Single Leg Stance with Support  - 1 x daily - 7 x weekly - 2 reps - 20 sec hold - Seated Heel Toe Raises  - 1 x daily - 7 x weekly -  2 sets - 10 reps - Seated Ankle Alphabet  - 1 x daily - 7 x weekly - 1-2 reps - Seated Ankle Circles  - 1 x daily - 7 x weekly - 2 sets - 10 reps - Towel Scrunches  - 1 x daily - 7 x weekly - 2 sets - 10 reps - Seated Heel Raise  -  1 x daily - 7 x weekly - 2 sets - 3 reps - 10 sec hold - Standing Heel Raises  - 1 x daily - 7 x weekly - 2 sets - 3 reps - 20 sec hold - Eccentric Heel Lowering on Step  - 1 x daily - 7 x weekly - 10 reps   ASSESSMENT:  CLINICAL IMPRESSION: Thomas Ballard presents to skilled PT reporting that he generally only feels pain when he is playing tennis.  Patient with difficulty during session with ankle alphabet and BAPS board, likely difficulty to his muscle weakness with ankle eversion/inversion.  Patient provided with updated handout to include exercises with ankle ROM in various directions to encourage increased strengthening of ankle intrinsic muscles.  Patient with increased muscle tightness noted that did improve with soft tissue mobilization.  OBJECTIVE IMPAIRMENTS: difficulty walking, decreased ROM, decreased strength, increased fascial restrictions, increased muscle spasms, impaired flexibility, postural dysfunction, and pain.   ACTIVITY LIMITATIONS: squatting, stairs, and transfers  PARTICIPATION LIMITATIONS: cleaning, driving, shopping, community activity, occupation, and yard work  PERSONAL FACTORS: Profession and Time since onset of injury/illness/exacerbation are also affecting patient's functional outcome.   REHAB POTENTIAL: Good  CLINICAL DECISION MAKING: Stable/uncomplicated  EVALUATION COMPLEXITY: Low   GOALS: Goals reviewed with patient? Yes  SHORT TERM GOALS: Target date: 09/06/2023   Be independent in initial HEP Baseline: Goal status: Ongoing  2.  Be able to discontinue boot Baseline:  Goal status: Ongoing  LONG TERM GOALS: Target date: 10/04/2023   Be independent in advanced HEP Baseline:  Goal status: INITIAL  2.  Improve LEFS by  3-5 points  Baseline:  Goal status: INITIAL  3.  Be able to resume tennis and golf Baseline:  Goal status: INITIAL  4.  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  Be able to do single leg heel raise on left x 5 Baseline:  Goal status: INITIAL    PLAN:  PT FREQUENCY: 2x/week  PT DURATION: 8 weeks  PLANNED INTERVENTIONS: 97110-Therapeutic exercises, 97530- Therapeutic activity, [id number]- Neuromuscular re-education, 97535- Self Care, 02859- Manual therapy, [id number]- Gait training, [phone]- Canalith repositioning, [id number]- Aquatic Therapy, 97014- Electrical stimulation (unattended), [id number]- Electrical stimulation (manual), 97016- Vasopneumatic device, 97033- Ionotophoresis 4mg /ml Dexamethasone, Patient/Family education, Balance training, Stair training, Taping, Dry Needling, Joint mobilization, Cryotherapy, Moist heat, and Biofeedback  PLAN FOR NEXT SESSION: Review HEP, DN to Lt gastroc, manual to Lt achilles and associated soft tissue, proprioception   Jarrell Laming, PT, DPT 08/16/23, 9:04 AM  [facility] [address] [place], [place] [address] Phone # [phone] Fax [phone]

## 2023-08-17 ENCOUNTER — Ambulatory Visit
Admission: RE | Admit: 2023-08-17 | Discharge: 2023-08-17 | Disposition: A | Discharge status: Home / Self Care | Payer: Self-pay | Source: Ambulatory Visit | Attending: Podiatry | Admitting: Podiatry

## 2023-08-17 DIAGNOSIS — M7662 Achilles tendinitis, left leg: Secondary | ICD-10-CM

## 2023-08-19 ENCOUNTER — Ambulatory Visit: Payer: 59

## 2023-08-19 DIAGNOSIS — M25572 Pain in left ankle and joints of left foot: Secondary | ICD-10-CM | POA: Diagnosis not present

## 2023-08-19 DIAGNOSIS — R293 Abnormal posture: Secondary | ICD-10-CM

## 2023-08-19 DIAGNOSIS — M6281 Muscle weakness (generalized): Secondary | ICD-10-CM

## 2023-08-19 DIAGNOSIS — R252 Cramp and spasm: Secondary | ICD-10-CM

## 2023-08-19 DIAGNOSIS — R262 Difficulty in walking, not elsewhere classified: Secondary | ICD-10-CM

## 2023-08-19 NOTE — Therapy (Signed)
 OUTPATIENT PHYSICAL THERAPY TREATMENT NOTE   Patient Name: Thomas Ballard MRN: [id number] DOB:06-14-1961, 63 y.o., male Today's Date: 08/19/2023  END OF SESSION:  PT End of Session - 08/19/23 0806     Visit Number 3    Date for PT Re-Evaluation 10/04/23    Authorization Type [employer]    Progress Note Due on Visit 10    PT Start Time 0802    PT Stop Time 0848    PT Time Calculation (min) 46 min    Activity Tolerance Patient tolerated treatment well    Behavior During Therapy WFL for tasks assessed/performed             Past Medical History:  Diagnosis Date   DM (diabetes mellitus), type 2 with complications (HCC)    Gout    Hyperlipidemia    Hypertension    Hypothyroidism    OSA (obstructive sleep apnea)    PSVT (paroxysmal supraventricular tachycardia) (HCC)    Sleep apnea    Past Surgical History:  Procedure Laterality Date   BACK SURGERY  1983   There are no active problems to display for this patient.   PCP: Marvene Barter, FNP  REFERRING PROVIDER: Silva Cornet, DPM  REFERRING DIAG:  [phone] (ICD-10-CM) - Gastrocnemius equinus of left lower extremity  THERAPY DIAG:  Pain in left ankle and joints of left foot  Difficulty in walking, not elsewhere classified  Muscle weakness (generalized)  Cramp and spasm  Abnormal posture  Rationale for Evaluation and Treatment: Rehabilitation  ONSET DATE: 08/08/2023  SUBJECTIVE:   SUBJECTIVE STATEMENT: Pt states that he is doing better.  I believe the PT is really helping.  I did go to the gym the other day and took the boot off to do some things but I primarily did core and upper body.  Did not have any issues but felt a little sore from being out of the boot.    From MD note:  The patient, with a history of diabetes and previous back surgery, presents with a long-standing sensation in the left foot, described as 'bubble wrap,' which has now extended to the right foot. This sensation has been present for  approximately five to six years. The patient also reports a feeling of 'gout' in the right big toe, without the severe pain typically associated with gout. The patient experiences intermittent 'weird' feelings when dragging the left foot on bed sheets, which do not persist. The patient is concerned about the progression of these symptoms and is seeking ways to prevent further deterioration.   PERTINENT HISTORY: na  PAIN:  Are you having pain? Yes: NPRS scale: currently 0-1/10 Pain location: left achilles Pain description: tightness Aggravating factors: tennis, excessive walking (has to fly for work and having to walk rapidly through airport) Relieving factors: Rest, ice  PRECAUTIONS: None  RED FLAGS: None   WEIGHT BEARING RESTRICTIONS: No  FALLS:  Has patient fallen in last 6 months? No  LIVING ENVIRONMENT: Lives with: lives with their spouse Lives in: House/apartment  OCCUPATION: Sales rep, mostly desk, some traveling  PLOF: Independent, Independent with basic ADLs, Independent with household mobility without device, Independent with community mobility without device, Independent with homemaking with ambulation, Independent with gait, and Independent with transfers  PATIENT GOALS: He hopes to be able to recover fully and return to golf and tennis.   NEXT MD VISIT: na  OBJECTIVE:  Note: Objective measures were completed at Evaluation unless otherwise noted.  DIAGNOSTIC FINDINGS: MRI ordered for  Lt foot/ankle  PATIENT SURVEYS:  08/16/2023:  LEFS  56 / 80 = 70.0 %  COGNITION: Overall cognitive status: Within functional limits for tasks assessed     SENSATION: WFL   POSTURE:  left foot pronated > right  PALPATION: Non tender and medial and lateral stabilizers, minimal tenderness over left achilles  LOWER EXTREMITY ROM:  Active ROM Right eval Left eval  Hip flexion    Hip extension    Hip abduction    Hip adduction    Hip internal rotation    Hip external  rotation    Knee flexion    Knee extension    Ankle dorsiflexion 18 14  Ankle plantarflexion 52 42  Ankle inversion 20 18  Ankle eversion 12 22   (Blank rows = not tested)  LOWER EXTREMITY MMT:  All generally 5/5 with exception of left ankle eversion 4+/5  LOWER EXTREMITY SPECIAL TESTS:  Ankle special tests: Thompson's test: negative  FUNCTIONAL TESTS:  08/16/2023 5 times sit to stand: 11.22 sec Timed up and go (TUG): 7.41 sec Single leg stance:  right- 21.54 sec, left- 4.31 sec  GAIT: Distance walked: 30 feet Assistive device utilized: None Level of assistance: Complete Independence Comments: antalgic                                                                                                                                TODAY'S TREATMENT  DATE: 08/19/2023 Recumbent bike x 5 min level 1 with PT present to discuss status Standing gastroc stretch on slanted rocker board 10 x 10 sec Standing soleus stretch on slanted rocker board 10 x 10 sec Step up and hold fwd and lateral x 10 each with 3 sec hold Cone touches 3 x 10 cone on high table (could not do cone on mat table 3 D ball toss at rebounder with red plyo ball x 20 each Lateral band walks with yellow loop 3 laps of 15 feet Seated left ankle alphabet A-Z left foot Trigger Point Dry Needling Initial Treatment: Pt instructed on Dry Needling rational, procedures, and possible side effects. Pt instructed to expect mild to moderate muscle soreness later in the day and/or into the next day.  Pt instructed in methods to reduce muscle soreness. Pt instructed to continue prescribed HEP. Because Dry Needling was performed over or adjacent to a lung field, pt was educated on S/S of pneumothorax and to seek immediate medical attention should they occur.  Patient was educated on signs and symptoms of infection and other risk factors and advised to seek medical attention should they occur.  Patient verbalized understanding of these  instructions and education.  Patient Verbal Consent Given: Yes Education Handout Provided: Previously Provided Muscles Treated: left gastroc and soleus Electrical Stimulation Performed: No Treatment Response/Outcome: Skilled palpation used to identify taut bands and trigger points.  Once identified, dry needling techniques used to treat these areas.  Twitch response ellicited along with palpable  elongation of muscle.  Following treatment, patient reports mild soreness but about the same stiffness in the actual ankle.     DATE: 08/16/2023 Nustep level 3 x6 min with PT present to discuss status Lower Extremity Functional Score: 56 / 80 = 70.0 % 5 times sit to stand, TUG, Single Leg Stance Standing calf stretch on slanted rocker board 2x20 sec Seated left ankle alphabet A-Z left foot Seated left foot towel scrunches x20 Seated heel/toe raises x20 Seated left ankle BAPS board for CW and CCW circles x10 each Seated ankle circles for CW and CCW x10 Manual:  soft tissue mobilization to left lower leg and plantar surface of foot to promote improved tissue mobility.  Patient with manual trigger point release to areas of trigger points, more specifically left anterior tib.   DATE: 08/09/23 HEP established- see below     PATIENT EDUCATION:  Education details: Initiated HEP Person educated: Patient Education method: Programmer, Multimedia, Facilities Manager, and Handouts Education comprehension: verbalized understanding and returned demonstration  HOME EXERCISE PROGRAM: Access Code: [email or web address] URL: [email or web address] Date: 08/16/2023 Prepared by: Jarrell Menke  Exercises - Gastroc Stretch on Wall  - 1 x daily - 7 x weekly - 2 reps - 30 sec hold - Soleus Stretch on Wall  - 1 x daily - 7 x weekly - 2 reps - 30 sec hold - Single Leg Stance with Support  - 1 x daily - 7 x weekly - 2 reps - 20 sec hold - Seated Heel Toe Raises  - 1 x daily - 7 x weekly - 2 sets - 10 reps - Seated Ankle Alphabet   - 1 x daily - 7 x weekly - 1-2 reps - Seated Ankle Circles  - 1 x daily - 7 x weekly - 2 sets - 10 reps - Towel Scrunches  - 1 x daily - 7 x weekly - 2 sets - 10 reps - Seated Heel Raise  - 1 x daily - 7 x weekly - 2 sets - 3 reps - 10 sec hold - Standing Heel Raises  - 1 x daily - 7 x weekly - 2 sets - 3 reps - 20 sec hold - Eccentric Heel Lowering on Step  - 1 x daily - 7 x weekly - 10 reps   ASSESSMENT:  CLINICAL IMPRESSION: Mr Specht responded well to last session and is progressing appropriately.  He demonstrates significant hip weakness when doing dynamic SLS activities today.  This may be contributing to excessive stress on the ankle for the activities that he enjoys doing (golf , tennis).  He also continues to have limited eversion.  We added dry needling today to reduce spasm in the calf and anterior tib.  He denies any pain throughout all activities today.  He should continue to do well.  He would benefit from continuing skilled PT to meet goals and resum PLOF.    OBJECTIVE IMPAIRMENTS: difficulty walking, decreased ROM, decreased strength, increased fascial restrictions, increased muscle spasms, impaired flexibility, postural dysfunction, and pain.   ACTIVITY LIMITATIONS: squatting, stairs, and transfers  PARTICIPATION LIMITATIONS: cleaning, driving, shopping, community activity, occupation, and yard work  PERSONAL FACTORS: Profession and Time since onset of injury/illness/exacerbation are also affecting patient's functional outcome.   REHAB POTENTIAL: Good  CLINICAL DECISION MAKING: Stable/uncomplicated  EVALUATION COMPLEXITY: Low   GOALS: Goals reviewed with patient? Yes  SHORT TERM GOALS: Target date: 09/06/2023   Be independent in initial HEP Baseline: Goal status: Ongoing  2.  Be able to discontinue  boot Baseline:  Goal status: Ongoing  LONG TERM GOALS: Target date: 10/04/2023   Be independent in advanced HEP Baseline:  Goal status: INITIAL  2.  Improve  LEFS by 3-5 points  Baseline:  Goal status: INITIAL  3.  Be able to resume tennis and golf Baseline:  Goal status: INITIAL  4.  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  Be able to do single leg heel raise on left x 5 Baseline:  Goal status: INITIAL    PLAN:  PT FREQUENCY: 2x/week  PT DURATION: 8 weeks  PLANNED INTERVENTIONS: 97110-Therapeutic exercises, 97530- Therapeutic activity, [id number]- Neuromuscular re-education, 97535- Self Care, [id number]- Manual therapy, [id number]- Gait training, [phone]- Canalith repositioning, [id number]- Aquatic Therapy, 97014- Electrical stimulation (unattended), [id number]- Electrical stimulation (manual), 97016- Vasopneumatic device, 97033- Ionotophoresis 4mg /ml Dexamethasone, Patient/Family education, Balance training, Stair training, Taping, Dry Needling, Joint mobilization, Cryotherapy, Moist heat, and Biofeedback  PLAN FOR NEXT SESSION: Review HEP, DN to Lt gastroc, soleus, anterior tib and add posterior tib, if patient has good response, manual to Lt achilles and associated soft tissue, proprioception   Kierstan Auer B. Alandis Bluemel, PT 08/19/23 8:53 AM [facility] [address], [address] [place], [place] [address] Phone # [phone] Fax [phone]

## 2023-08-23 ENCOUNTER — Ambulatory Visit: Payer: 59 | Admitting: Rehabilitative and Restorative Service Providers"

## 2023-08-23 ENCOUNTER — Encounter: Payer: Self-pay | Admitting: Rehabilitative and Restorative Service Providers"

## 2023-08-23 DIAGNOSIS — R262 Difficulty in walking, not elsewhere classified: Secondary | ICD-10-CM

## 2023-08-23 DIAGNOSIS — M25572 Pain in left ankle and joints of left foot: Secondary | ICD-10-CM | POA: Diagnosis not present

## 2023-08-23 DIAGNOSIS — R252 Cramp and spasm: Secondary | ICD-10-CM

## 2023-08-23 DIAGNOSIS — M6281 Muscle weakness (generalized): Secondary | ICD-10-CM

## 2023-08-23 NOTE — Therapy (Signed)
OUTPATIENT PHYSICAL THERAPY TREATMENT NOTE   Patient Name: Thomas Ballard MRN: [id number] DOB:[birth year], 63 y.o., male Today's Date: 08/23/2023  END OF SESSION:  PT End of Session - 08/23/23 0810     Visit Number 4    Date for PT Re-Evaluation 10/04/23    Authorization Type [employer]    Progress Note Due on Visit 10    PT Start Time 0801    PT Stop Time 0840    PT Time Calculation (min) 39 min    Activity Tolerance Patient tolerated treatment well    Behavior During Therapy WFL for tasks assessed/performed             Past Medical History:  Diagnosis Date   DM (diabetes mellitus), type 2 with complications (HCC)    Gout    Hyperlipidemia    Hypertension    Hypothyroidism    OSA (obstructive sleep apnea)    PSVT (paroxysmal supraventricular tachycardia) (HCC)    Sleep apnea    Past Surgical History:  Procedure Laterality Date   BACK SURGERY  1983   There are no active problems to display for this patient.   PCP: Peri Maris, FNP  REFERRING PROVIDER: Sharl Ma, DPM  REFERRING DIAG:  [phone] (ICD-10-CM) - Gastrocnemius equinus of left lower extremity  THERAPY DIAG:  Pain in left ankle and joints of left foot  Difficulty in walking, not elsewhere classified  Muscle weakness (generalized)  Cramp and spasm  Rationale for Evaluation and Treatment: Rehabilitation  ONSET DATE: 08/08/2023  SUBJECTIVE:   SUBJECTIVE STATEMENT: Pt states that he is still feeling better.  States that he is trying to use his boot less, but is still using it some.  From MD note:  The patient, with a history of diabetes and previous back surgery, presents with a long-standing sensation in the left foot, described as 'bubble wrap,' which has now extended to the right foot. This sensation has been present for approximately five to six years. The patient also reports a feeling of 'gout' in the right big toe, without the severe pain typically associated with gout. The patient  experiences intermittent 'weird' feelings when dragging the left foot on bed sheets, which do not persist. The patient is concerned about the progression of these symptoms and is seeking ways to prevent further deterioration.   PERTINENT HISTORY: na  PAIN:  Are you having pain? Yes: NPRS scale: currently 0-1/10 Pain location: left achilles Pain description: tightness Aggravating factors: tennis, excessive walking (has to fly for work and having to walk rapidly through airport) Relieving factors: Rest, ice  PRECAUTIONS: None  RED FLAGS: None   WEIGHT BEARING RESTRICTIONS: No  FALLS:  Has patient fallen in last 6 months? No  LIVING ENVIRONMENT: Lives with: lives with their spouse Lives in: House/apartment  OCCUPATION: Sales rep, mostly desk, some traveling  PLOF: Independent, Independent with basic ADLs, Independent with household mobility without device, Independent with community mobility without device, Independent with homemaking with ambulation, Independent with gait, and Independent with transfers  PATIENT GOALS: He hopes to be able to recover fully and return to golf and tennis.   NEXT MD VISIT: na  OBJECTIVE:  Note: Objective measures were completed at Evaluation unless otherwise noted.  DIAGNOSTIC FINDINGS: MRI ordered for Lt foot/ankle  PATIENT SURVEYS:  08/16/2023:  LEFS  56 / 80 = 70.0 %  COGNITION: Overall cognitive status: Within functional limits for tasks assessed     SENSATION: WFL   POSTURE:  left foot  pronated > right  PALPATION: Non tender and medial and lateral stabilizers, minimal tenderness over left achilles  LOWER EXTREMITY ROM:  Active ROM Right eval Left eval  Hip flexion    Hip extension    Hip abduction    Hip adduction    Hip internal rotation    Hip external rotation    Knee flexion    Knee extension    Ankle dorsiflexion 18 14  Ankle plantarflexion 52 42  Ankle inversion 20 18  Ankle eversion 12 22   (Blank rows = not  tested)  LOWER EXTREMITY MMT:  All generally 5/5 with exception of left ankle eversion 4+/5  LOWER EXTREMITY SPECIAL TESTS:  Ankle special tests: Thompson's test: negative  FUNCTIONAL TESTS:  08/16/2023 5 times sit to stand: 11.22 sec Timed up and go (TUG): 7.41 sec Single leg stance:  right- 21.54 sec, left- 4.31 sec  GAIT: Distance walked: 30 feet Assistive device utilized: None Level of assistance: Complete Independence Comments: antalgic                                                                                                                                TODAY'S TREATMENT   DATE: 08/23/2023 Recumbent bike level 1 x6 min with PT present to discuss status Seated left ankle CW and CCW circles 2x10 each direction Seated left ankle alphabet A-Z left foot Standing gastroc stretch on pro-stretch 2x20 sec left foot Standing soleus stretch on pro-stretch 2x20 sec left foot Standing back extension x5 Standing left single leg stance on foam pad 3x10 sec (with occasional UE support as needed) Seated 4 way ankle strengthening with red tband 2x10 each left ankle Standing gastroc stretch on pro-stretch 2x20 sec left foot Retrogait on treadmill -0.9 mph x3 min Tandem gait on AirEx beam in parallel bars down and back x3 Side stepping on AirEx beam in parallel bars down and back x3   DATE: 08/19/2023 Recumbent bike x 5 min level 1 with PT present to discuss status Standing gastroc stretch on slanted rocker board 10 x 10 sec Standing soleus stretch on slanted rocker board 10 x 10 sec Step up and hold fwd and lateral x 10 each with 3 sec hold Cone touches 3 x 10 cone on high table (could not do cone on mat table 3 D ball toss at rebounder with red plyo ball x 20 each Lateral band walks with yellow loop 3 laps of 15 feet Seated left ankle alphabet A-Z left foot Trigger Point Dry Needling Initial Treatment: Pt instructed on Dry Needling rational, procedures, and possible side  effects. Pt instructed to expect mild to moderate muscle soreness later in the day and/or into the next day.  Pt instructed in methods to reduce muscle soreness. Pt instructed to continue prescribed HEP. Because Dry Needling was performed over or adjacent to a lung field, pt was educated on S/S of pneumothorax and to seek immediate medical attention should  they occur.  Patient was educated on signs and symptoms of infection and other risk factors and advised to seek medical attention should they occur.  Patient verbalized understanding of these instructions and education.  Patient Verbal Consent Given: Yes Education Handout Provided: Previously Provided Muscles Treated: left gastroc and soleus Electrical Stimulation Performed: No Treatment Response/Outcome: Skilled palpation used to identify taut bands and trigger points.  Once identified, dry needling techniques used to treat these areas.  Twitch response ellicited along with palpable elongation of muscle.  Following treatment, patient reports mild soreness but about the same stiffness in the actual ankle.     DATE: 08/16/2023 Nustep level 3 x6 min with PT present to discuss status Lower Extremity Functional Score: 56 / 80 = 70.0 % 5 times sit to stand, TUG, Single Leg Stance Standing calf stretch on slanted rocker board 2x20 sec Seated left ankle alphabet A-Z left foot Seated left foot towel scrunches x20 Seated heel/toe raises x20 Seated left ankle BAPS board for CW and CCW circles x10 each Seated ankle circles for CW and CCW x10 Manual:  soft tissue mobilization to left lower leg and plantar surface of foot to promote improved tissue mobility.  Patient with manual trigger point release to areas of trigger points, more specifically left anterior tib.    PATIENT EDUCATION:  Education details: Initiated HEP Person educated: Patient Education method: Explanation, Facilities manager, and Handouts Education comprehension: verbalized  understanding and returned demonstration  HOME EXERCISE PROGRAM: Access Code: [email or web address] URL: [email or web address] Date: 08/16/2023 Prepared by: Clydie Braun Calab Sachse  Exercises - Gastroc Stretch on Wall  - 1 x daily - 7 x weekly - 2 reps - 30 sec hold - Soleus Stretch on Wall  - 1 x daily - 7 x weekly - 2 reps - 30 sec hold - Single Leg Stance with Support  - 1 x daily - 7 x weekly - 2 reps - 20 sec hold - Seated Heel Toe Raises  - 1 x daily - 7 x weekly - 2 sets - 10 reps - Seated Ankle Alphabet  - 1 x daily - 7 x weekly - 1-2 reps - Seated Ankle Circles  - 1 x daily - 7 x weekly - 2 sets - 10 reps - Towel Scrunches  - 1 x daily - 7 x weekly - 2 sets - 10 reps - Seated Heel Raise  - 1 x daily - 7 x weekly - 2 sets - 3 reps - 10 sec hold - Standing Heel Raises  - 1 x daily - 7 x weekly - 2 sets - 3 reps - 20 sec hold - Eccentric Heel Lowering on Step  - 1 x daily - 7 x weekly - 10 reps   ASSESSMENT:  CLINICAL IMPRESSION: Mr Wyss presents to skilled PT reporting that dry needling did seem to help him feel some looser.  Patient continues to report pain of 0-1/10.  Patient continues to progress with flexibility, balance, and dynamic balance/tasks.  Patient continues to work to maintain neutral foot alignment with side stepping with band.  Added retrogait for improved focus on posterior muscle not often used with forward gait.  Patient continues to progress towards goal related activities.  OBJECTIVE IMPAIRMENTS: difficulty walking, decreased ROM, decreased strength, increased fascial restrictions, increased muscle spasms, impaired flexibility, postural dysfunction, and pain.   ACTIVITY LIMITATIONS: squatting, stairs, and transfers  PARTICIPATION LIMITATIONS: cleaning, driving, shopping, community activity, occupation, and yard work  PERSONAL FACTORS: Profession and Time since onset of injury/illness/exacerbation  are also affecting patient's functional outcome.   REHAB POTENTIAL:  Good  CLINICAL DECISION MAKING: Stable/uncomplicated  EVALUATION COMPLEXITY: Low   GOALS: Goals reviewed with patient? Yes  SHORT TERM GOALS: Target date: 09/06/2023   Be independent in initial HEP Baseline: Goal status: Met on 08/23/2023  2.  Be able to discontinue boot Baseline:  Goal status: Ongoing  LONG TERM GOALS: Target date: 10/04/2023   Be independent in advanced HEP Baseline:  Goal status: INITIAL  2.  Improve LEFS by 3-5 points  Baseline:  Goal status: INITIAL  3.  Be able to resume tennis and golf Baseline:  Goal status: INITIAL  4.  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  Be able to do single leg heel raise on left x 5 Baseline:  Goal status: INITIAL    PLAN:  PT FREQUENCY: 2x/week  PT DURATION: 8 weeks  PLANNED INTERVENTIONS: 97110-Therapeutic exercises, 97530- Therapeutic activity, [id number]- Neuromuscular re-education, 97535- Self Care, 16109- Manual therapy, [id number]- Gait training, [phone]- Canalith repositioning, [id number]- Aquatic Therapy, 97014- Electrical stimulation (unattended), [phone]- Electrical stimulation (manual), 97016- Vasopneumatic device, 97033- Ionotophoresis 4mg /ml Dexamethasone, Patient/Family education, Balance training, Stair training, Taping, Dry Needling, Joint mobilization, Cryotherapy, Moist heat, and Biofeedback  PLAN FOR NEXT SESSION: Review HEP, DN to Lt gastroc, soleus, anterior tib and add posterior tib, if patient has good response, manual to Lt achilles and associated soft tissue, proprioception   Reather Laurence, PT, DPT 08/23/23, 9:02 AM  [facility] [address] [place], [place] [address] Phone # [phone] Fax [phone]

## 2023-08-25 ENCOUNTER — Encounter: Payer: Self-pay | Admitting: Podiatry

## 2023-08-26 ENCOUNTER — Ambulatory Visit: Payer: 59

## 2023-08-26 DIAGNOSIS — R262 Difficulty in walking, not elsewhere classified: Secondary | ICD-10-CM

## 2023-08-26 DIAGNOSIS — M25572 Pain in left ankle and joints of left foot: Secondary | ICD-10-CM

## 2023-08-26 DIAGNOSIS — M6281 Muscle weakness (generalized): Secondary | ICD-10-CM

## 2023-08-26 DIAGNOSIS — R252 Cramp and spasm: Secondary | ICD-10-CM

## 2023-08-26 DIAGNOSIS — R293 Abnormal posture: Secondary | ICD-10-CM

## 2023-08-26 NOTE — Therapy (Signed)
OUTPATIENT PHYSICAL THERAPY TREATMENT NOTE   Patient Name: Thomas Ballard MRN: [id number] DOB:[birth year], 63 y.o., male Today's Date: 08/26/2023  END OF SESSION:  PT End of Session - 08/26/23 0803     Visit Number 5    Date for PT Re-Evaluation 10/04/23    Authorization Type U[employer]    Progress Note Due on Visit 10    PT Start Time 0804    PT Stop Time 0848    PT Time Calculation (min) 44 min    Activity Tolerance Patient tolerated treatment well    Behavior During Therapy WFL for tasks assessed/performed             Past Medical History:  Diagnosis Date   DM (diabetes mellitus), type 2 with complications (HCC)    Gout    Hyperlipidemia    Hypertension    Hypothyroidism    OSA (obstructive sleep apnea)    PSVT (paroxysmal supraventricular tachycardia) (HCC)    Sleep apnea    Past Surgical History:  Procedure Laterality Date   BACK SURGERY  1983   There are no active problems to display for this patient.   PCP: Peri Maris, FNP  REFERRING PROVIDER: Sharl Ma, DPM  REFERRING DIAG:  [phone] (ICD-10-CM) - Gastrocnemius equinus of left lower extremity  THERAPY DIAG:  Pain in left ankle and joints of left foot  Difficulty in walking, not elsewhere classified  Muscle weakness (generalized)  Cramp and spasm  Abnormal posture  Rationale for Evaluation and Treatment: Rehabilitation  ONSET DATE: 08/08/2023  SUBJECTIVE:   SUBJECTIVE STATEMENT: Pt reports he got results of MRI.  Results are as follows:  IMPRESSION: Partial high-grade non full-thickness tear of the Achilles tendon 3.4 cm from the insertion. No pain reported upon entering  clinic today.  It really hasn't bothered me since I started wearing the boot.  I have taken it off to go from car to a meeting in an office building occasionally but still no real pain with that.  Pain reported at 1/10.    From MD note:  The patient, with a history of diabetes and previous back surgery, presents  with a long-standing sensation in the left foot, described as 'bubble wrap,' which has now extended to the right foot. This sensation has been present for approximately five to six years. The patient also reports a feeling of 'gout' in the right big toe, without the severe pain typically associated with gout. The patient experiences intermittent 'weird' feelings when dragging the left foot on bed sheets, which do not persist. The patient is concerned about the progression of these symptoms and is seeking ways to prevent further deterioration.   PERTINENT HISTORY: na  PAIN:  08/26/23 Are you having pain? Yes: NPRS scale: currently 1/10 Pain location: left achilles Pain description: tightness Aggravating factors: tennis, excessive walking (has to fly for work and having to walk rapidly through airport) Relieving factors: Rest, ice  PRECAUTIONS: None  RED FLAGS: None   WEIGHT BEARING RESTRICTIONS: No  FALLS:  Has patient fallen in last 6 months? No  LIVING ENVIRONMENT: Lives with: lives with their spouse Lives in: House/apartment  OCCUPATION: Sales rep, mostly desk, some traveling  PLOF: Independent, Independent with basic ADLs, Independent with household mobility without device, Independent with community mobility without device, Independent with homemaking with ambulation, Independent with gait, and Independent with transfers  PATIENT GOALS: He hopes to be able to recover fully and return to golf and tennis.   NEXT MD VISIT:  na  OBJECTIVE:  Note: Objective measures were completed at Evaluation unless otherwise noted.  DIAGNOSTIC FINDINGS: MRI ordered for Lt foot/ankle  PATIENT SURVEYS:  08/16/2023:  LEFS  56 / 80 = 70.0 %  COGNITION: Overall cognitive status: Within functional limits for tasks assessed     SENSATION: WFL   POSTURE:  left foot pronated > right  PALPATION: Non tender and medial and lateral stabilizers, minimal tenderness over left achilles  LOWER  EXTREMITY ROM:  Active ROM Right eval Left eval  Hip flexion    Hip extension    Hip abduction    Hip adduction    Hip internal rotation    Hip external rotation    Knee flexion    Knee extension    Ankle dorsiflexion 18 14  Ankle plantarflexion 52 42  Ankle inversion 20 18  Ankle eversion 12 22   (Blank rows = not tested)  LOWER EXTREMITY MMT:  All generally 5/5 with exception of left ankle eversion 4+/5  LOWER EXTREMITY SPECIAL TESTS:  Ankle special tests: Thompson's test: negative  FUNCTIONAL TESTS:  08/16/2023 5 times sit to stand: 11.22 sec Timed up and go (TUG): 7.41 sec Single leg stance:  right- 21.54 sec, left- 4.31 sec  GAIT: Distance walked: 30 feet Assistive device utilized: None Level of assistance: Complete Independence Comments: antalgic                                                                                                                                TODAY'S TREATMENT  DATE: 08/26/2023 Nustep level 1 x 6 min with PT present to discuss status Rocker board x 2 min Gastroc stretch on rocker board propped on balance pad x 10 holding 10 sec each bilateral Soleus stretch on rocker board propped on balance pad x 10 holding 10 sec each left only Inversion/eversion stretch on half roll x 20 each direction Cone touches 3 x 10 (cone on back counter, standing on floor) Lateral band walks with yellow loop 3 laps of 15 feet Step up and hold on airex x 10 fwd and x 10 lateral Manual: Hawks grip IASTM x 6 min, PROM x 2 min, STM to gastroc x 2 min  DATE: 08/23/2023 Recumbent bike level 1 x6 min with PT present to discuss status Seated left ankle CW and CCW circles 2x10 each direction Seated left ankle alphabet A-Z left foot Standing gastroc stretch on pro-stretch 2x20 sec left foot Standing soleus stretch on pro-stretch 2x20 sec left foot Standing back extension x5 Standing left single leg stance on foam pad 3x10 sec (with occasional UE support as  needed) Seated 4 way ankle strengthening with red tband 2x10 each left ankle Standing gastroc stretch on pro-stretch 2x20 sec left foot Retrogait on treadmill -0.9 mph x3 min Tandem gait on AirEx beam in parallel bars down and back x3 Side stepping on AirEx beam in parallel bars down and back x3   DATE:  08/19/2023 Recumbent bike x 5 min level 1 with PT present to discuss status Standing gastroc stretch on slanted rocker board 10 x 10 sec Standing soleus stretch on slanted rocker board 10 x 10 sec Step up and hold fwd and lateral x 10 each with 3 sec hold Cone touches 3 x 10 cone on high table (could not do cone on mat table 3 D ball toss at rebounder with red plyo ball x 20 each Lateral band walks with yellow loop 3 laps of 15 feet Seated left ankle alphabet A-Z left foot Trigger Point Dry Needling Initial Treatment: Pt instructed on Dry Needling rational, procedures, and possible side effects. Pt instructed to expect mild to moderate muscle soreness later in the day and/or into the next day.  Pt instructed in methods to reduce muscle soreness. Pt instructed to continue prescribed HEP. Because Dry Needling was performed over or adjacent to a lung field, pt was educated on S/S of pneumothorax and to seek immediate medical attention should they occur.  Patient was educated on signs and symptoms of infection and other risk factors and advised to seek medical attention should they occur.  Patient verbalized understanding of these instructions and education.  Patient Verbal Consent Given: Yes Education Handout Provided: Previously Provided Muscles Treated: left gastroc and soleus Electrical Stimulation Performed: No Treatment Response/Outcome: Skilled palpation used to identify taut bands and trigger points.  Once identified, dry needling techniques used to treat these areas.  Twitch response ellicited along with palpable elongation of muscle.  Following treatment, patient reports mild soreness  but about the same stiffness in the actual ankle.     DATE: 08/16/2023 Nustep level 3 x6 min with PT present to discuss status Lower Extremity Functional Score: 56 / 80 = 70.0 % 5 times sit to stand, TUG, Single Leg Stance Standing calf stretch on slanted rocker board 2x20 sec Seated left ankle alphabet A-Z left foot Seated left foot towel scrunches x20 Seated heel/toe raises x20 Seated left ankle BAPS board for CW and CCW circles x10 each Seated ankle circles for CW and CCW x10 Manual:  soft tissue mobilization to left lower leg and plantar surface of foot to promote improved tissue mobility.  Patient with manual trigger point release to areas of trigger points, more specifically left anterior tib.    PATIENT EDUCATION:  Education details: Initiated HEP Person educated: Patient Education method: Explanation, Facilities manager, and Handouts Education comprehension: verbalized understanding and returned demonstration  HOME EXERCISE PROGRAM: Access Code: [email or web address] URL: [email or web address] Date: 08/16/2023 Prepared by: Clydie Braun Menke  Exercises - Gastroc Stretch on Wall  - 1 x daily - 7 x weekly - 2 reps - 30 sec hold - Soleus Stretch on Wall  - 1 x daily - 7 x weekly - 2 reps - 30 sec hold - Single Leg Stance with Support  - 1 x daily - 7 x weekly - 2 reps - 20 sec hold - Seated Heel Toe Raises  - 1 x daily - 7 x weekly - 2 sets - 10 reps - Seated Ankle Alphabet  - 1 x daily - 7 x weekly - 1-2 reps - Seated Ankle Circles  - 1 x daily - 7 x weekly - 2 sets - 10 reps - Towel Scrunches  - 1 x daily - 7 x weekly - 2 sets - 10 reps - Seated Heel Raise  - 1 x daily - 7 x weekly - 2 sets - 3 reps - 10 sec hold -  Standing Heel Raises  - 1 x daily - 7 x weekly - 2 sets - 3 reps - 20 sec hold - Eccentric Heel Lowering on Step  - 1 x daily - 7 x weekly - 10 reps   ASSESSMENT:  CLINICAL IMPRESSION: Mr Lafavor is progressing appropriately.  He got MRI results and there is a partial  tear at the achilles which is quite visible and was expected.  He continues to report minimal pain but has not returned to sport.  (Tennis and golf).  We focused on eccentric loading, ankle stability and hip strength.  We also added in IASTM to work on scar tissue mobilization and STM to gastroc/soleus.  He had no increase in pain with this.  He will continue to wear boot.  He would benefit from continued skilled PT for partial achilles tear rehab.    OBJECTIVE IMPAIRMENTS: difficulty walking, decreased ROM, decreased strength, increased fascial restrictions, increased muscle spasms, impaired flexibility, postural dysfunction, and pain.   ACTIVITY LIMITATIONS: squatting, stairs, and transfers  PARTICIPATION LIMITATIONS: cleaning, driving, shopping, community activity, occupation, and yard work  PERSONAL FACTORS: Profession and Time since onset of injury/illness/exacerbation are also affecting patient's functional outcome.   REHAB POTENTIAL: Good  CLINICAL DECISION MAKING: Stable/uncomplicated  EVALUATION COMPLEXITY: Low   GOALS: Goals reviewed with patient? Yes  SHORT TERM GOALS: Target date: 09/06/2023   Be independent in initial HEP Baseline: Goal status: Met on 08/23/2023  2.  Be able to discontinue boot Baseline:  Goal status: Ongoing  LONG TERM GOALS: Target date: 10/04/2023   Be independent in advanced HEP Baseline:  Goal status: INITIAL  2.  Improve LEFS by 3-5 points  Baseline:  Goal status: INITIAL  3.  Be able to resume tennis and golf Baseline:  Goal status: INITIAL  4.  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  Be able to do single leg heel raise on left x 5 Baseline:  Goal status: INITIAL    PLAN:  PT FREQUENCY: 2x/week  PT DURATION: 8 weeks  PLANNED INTERVENTIONS: 97110-Therapeutic exercises, 97530- Therapeutic activity, [id number]- Neuromuscular re-education, 97535- Self Care, 16109-  Manual therapy, [id number]- Gait training, [phone]- Canalith repositioning, [id number]- Aquatic Therapy, 97014- Electrical stimulation (unattended), [id number]- Electrical stimulation (manual), 97016- Vasopneumatic device, [id number]- Ionotophoresis 4mg /ml Dexamethasone, Patient/Family education, Balance training, Stair training, Taping, Dry Needling, Joint mobilization, Cryotherapy, Moist heat, and Biofeedback  PLAN FOR NEXT SESSION: Progress HEP, IASTM to achilles, DN to Lt gastroc, soleus, anterior tib and add posterior tib, if patient has good response, manual to Lt achilles and associated soft tissue, proprioception   Milynn Quirion B. Dina Warbington, PT 08/26/23 9:04 AM [facility] [address], [address] [place], [place] [address] Phone # [phone] Fax [phone]

## 2023-08-29 ENCOUNTER — Ambulatory Visit: Payer: 59

## 2023-08-29 DIAGNOSIS — M25572 Pain in left ankle and joints of left foot: Secondary | ICD-10-CM

## 2023-08-29 DIAGNOSIS — R293 Abnormal posture: Secondary | ICD-10-CM

## 2023-08-29 DIAGNOSIS — R262 Difficulty in walking, not elsewhere classified: Secondary | ICD-10-CM

## 2023-08-29 DIAGNOSIS — R252 Cramp and spasm: Secondary | ICD-10-CM

## 2023-08-29 DIAGNOSIS — M6281 Muscle weakness (generalized): Secondary | ICD-10-CM

## 2023-08-29 NOTE — Therapy (Signed)
 OUTPATIENT PHYSICAL THERAPY TREATMENT NOTE   Patient Name: Thomas Ballard MRN: [id number] DOB:1961/01/06, 63 y.o., male Today's Date: 08/29/2023  END OF SESSION:  PT End of Session - 08/29/23 0855     Visit Number 6    Date for PT Re-Evaluation 10/04/23    Authorization Type U[employer]    Progress Note Due on Visit 10    PT Start Time 0848    PT Stop Time 0934    PT Time Calculation (min) 46 min    Activity Tolerance Patient tolerated treatment well    Behavior During Therapy WFL for tasks assessed/performed             Past Medical History:  Diagnosis Date   DM (diabetes mellitus), type 2 with complications (HCC)    Gout    Hyperlipidemia    Hypertension    Hypothyroidism    OSA (obstructive sleep apnea)    PSVT (paroxysmal supraventricular tachycardia) (HCC)    Sleep apnea    Past Surgical History:  Procedure Laterality Date   BACK SURGERY  1983   There are no active problems to display for this patient.   PCP: Peri Maris, FNP  REFERRING PROVIDER: Sharl Ma, DPM  REFERRING DIAG:  [phone] (ICD-10-CM) - Gastrocnemius equinus of left lower extremity  THERAPY DIAG:  Pain in left ankle and joints of left foot  Difficulty in walking, not elsewhere classified  Muscle weakness (generalized)  Cramp and spasm  Abnormal posture  Rationale for Evaluation and Treatment: Rehabilitation  ONSET DATE: 08/08/2023  SUBJECTIVE:   SUBJECTIVE STATEMENT: Patient reports no pain.  "I feel like I am making progress"    From MD note:  The patient, with a history of diabetes and previous back surgery, presents with a long-standing sensation in the left foot, described as 'bubble wrap,' which has now extended to the right foot. This sensation has been present for approximately five to six years. The patient also reports a feeling of 'gout' in the right big toe, without the severe pain typically associated with gout. The patient experiences intermittent 'weird'  feelings when dragging the left foot on bed sheets, which do not persist. The patient is concerned about the progression of these symptoms and is seeking ways to prevent further deterioration.   PERTINENT HISTORY: na  PAIN:  08/29/23 Are you having pain? Yes: NPRS scale: currently 1/10 Pain location: left achilles Pain description: tightness Aggravating factors: tennis, excessive walking (has to fly for work and having to walk rapidly through airport) Relieving factors: Rest, ice  PRECAUTIONS: None  RED FLAGS: None   WEIGHT BEARING RESTRICTIONS: No  FALLS:  Has patient fallen in last 6 months? No  LIVING ENVIRONMENT: Lives with: lives with their spouse Lives in: House/apartment  OCCUPATION: Sales rep, mostly desk, some traveling  PLOF: Independent, Independent with basic ADLs, Independent with household mobility without device, Independent with community mobility without device, Independent with homemaking with ambulation, Independent with gait, and Independent with transfers  PATIENT GOALS: He hopes to be able to recover fully and return to golf and tennis.   NEXT MD VISIT: na  OBJECTIVE:  Note: Objective measures were completed at Evaluation unless otherwise noted.  DIAGNOSTIC FINDINGS:   IMPRESSION: Partial high-grade non full-thickness tear of the Achilles tendon 3.4 cm from the insertion.  PATIENT SURVEYS:  08/16/2023:  LEFS  56 / 80 = 70.0 %  COGNITION: Overall cognitive status: Within functional limits for tasks assessed     SENSATION: [facility]  POSTURE:  left foot pronated > right  PALPATION: Non tender and medial and lateral stabilizers, minimal tenderness over left achilles  LOWER EXTREMITY ROM:  Active ROM Right eval Left eval  Hip flexion    Hip extension    Hip abduction    Hip adduction    Hip internal rotation    Hip external rotation    Knee flexion    Knee extension    Ankle dorsiflexion 18 14  Ankle plantarflexion 52 42  Ankle  inversion 20 18  Ankle eversion 12 22   (Blank rows = not tested)  LOWER EXTREMITY MMT:  All generally 5/5 with exception of left ankle eversion 4+/5  LOWER EXTREMITY SPECIAL TESTS:  Ankle special tests: Thompson's test: negative  FUNCTIONAL TESTS:  08/16/2023 5 times sit to stand: 11.22 sec Timed up and go (TUG): 7.41 sec Single leg stance:  right- 21.54 sec, left- 4.31 sec  GAIT: Distance walked: 30 feet Assistive device utilized: None Level of assistance: Complete Independence Comments: antalgic                                                                                                                                TODAY'S TREATMENT  DATE: 08/29/2023 Nustep level 1 x 6 min with PT present to discuss status Rocker board x 2 min Gastroc stretch on rocker board propped on balance pad 3 x 30 sec Soleus stretch on rocker board propped on balance pad 3 x 30 sec Inversion/eversion stretch on half roll x 20 each direction (pausing on eversion for 2-3 stretch) Lateral band walks with yellow loop 3 laps of 15 feet Cone touches 3 x 10 (cone on low mat table, standing on floor) 3 D ball toss at rebounder (standing on floor, red plyo ball) BOSU push offs of involved (left) foot fwd x 10 and lateral x 10,  BOSU push off with uninvolved trying to stick landing on involved x 10 fwd and lateral Manual: Hawks grip IASTM x 6 min, PROM x 2 min, STM to gastroc x 2 min   DATE: 08/26/2023 Nustep level 1 x 6 min with PT present to discuss status Rocker board x 2 min Gastroc stretch on rocker board propped on balance pad x 10 holding 10 sec each bilateral Soleus stretch on rocker board propped on balance pad x 10 holding 10 sec each left only Inversion/eversion stretch on half roll x 20 each direction Cone touches 3 x 10 (cone on back counter, standing on floor) Lateral band walks with yellow loop 3 laps of 15 feet Step up and hold on airex x 10 fwd and x 10 lateral Manual: Hawks grip  IASTM x 6 min, PROM x 2 min, STM to gastroc x 2 min  DATE: 08/23/2023 Recumbent bike level 1 x6 min with PT present to discuss status Seated left ankle CW and CCW circles 2x10 each direction Seated left ankle alphabet A-Z left foot Standing gastroc  stretch on pro-stretch 2x20 sec left foot Standing soleus stretch on pro-stretch 2x20 sec left foot Standing back extension x5 Standing left single leg stance on foam pad 3x10 sec (with occasional UE support as needed) Seated 4 way ankle strengthening with red tband 2x10 each left ankle Standing gastroc stretch on pro-stretch 2x20 sec left foot Retrogait on treadmill -0.9 mph x3 min Tandem gait on AirEx beam in parallel bars down and back x3 Side stepping on AirEx beam in parallel bars down and back x3    PATIENT EDUCATION:  Education details: Initiated HEP Person educated: Patient Education method: Programmer, multimedia, Facilities manager, and Handouts Education comprehension: verbalized understanding and returned demonstration  HOME EXERCISE PROGRAM: Access Code: [email or web address] URL: [email or web address] Date: 08/16/2023 Prepared by: Clydie Braun Menke  Exercises - Gastroc Stretch on Wall  - 1 x daily - 7 x weekly - 2 reps - 30 sec hold - Soleus Stretch on Wall  - 1 x daily - 7 x weekly - 2 reps - 30 sec hold - Single Leg Stance with Support  - 1 x daily - 7 x weekly - 2 reps - 20 sec hold - Seated Heel Toe Raises  - 1 x daily - 7 x weekly - 2 sets - 10 reps - Seated Ankle Alphabet  - 1 x daily - 7 x weekly - 1-2 reps - Seated Ankle Circles  - 1 x daily - 7 x weekly - 2 sets - 10 reps - Towel Scrunches  - 1 x daily - 7 x weekly - 2 sets - 10 reps - Seated Heel Raise  - 1 x daily - 7 x weekly - 2 sets - 3 reps - 10 sec hold - Standing Heel Raises  - 1 x daily - 7 x weekly - 2 sets - 3 reps - 20 sec hold - Eccentric Heel Lowering on Step  - 1 x daily - 7 x weekly - 10 reps   ASSESSMENT:  CLINICAL IMPRESSION: Thomas Ballard is progressing  appropriately.  We added in some low level plyometrics today to assess tolerance.  He states he only felt a very mild twinge of discomfort with this.  He continues to have fairly prominent/bulky scar tissue in the achilles that we are addressing with IASTM and STM.  He will continue to wear boot.  He would benefit from continued skilled PT for partial achilles tear rehab.    OBJECTIVE IMPAIRMENTS: difficulty walking, decreased ROM, decreased strength, increased fascial restrictions, increased muscle spasms, impaired flexibility, postural dysfunction, and pain.   ACTIVITY LIMITATIONS: squatting, stairs, and transfers  PARTICIPATION LIMITATIONS: cleaning, driving, shopping, community activity, occupation, and yard work  PERSONAL FACTORS: Profession and Time since onset of injury/illness/exacerbation are also affecting patient's functional outcome.   REHAB POTENTIAL: Good  CLINICAL DECISION MAKING: Stable/uncomplicated  EVALUATION COMPLEXITY: Low   GOALS: Goals reviewed with patient? Yes  SHORT TERM GOALS: Target date: 09/06/2023   Be independent in initial HEP Baseline: Goal status: Met on 08/23/2023  2.  Be able to discontinue boot Baseline:  Goal status: Ongoing  LONG TERM GOALS: Target date: 10/04/2023   Be independent in advanced HEP Baseline:  Goal status: INITIAL  2.  Improve LEFS by 3-5 points  Baseline:  Goal status: INITIAL  3.  Be able to resume tennis and golf Baseline:  Goal status: INITIAL  4.  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  Be able to do single leg  heel raise on left x 5 Baseline:  Goal status: INITIAL    PLAN:  PT FREQUENCY: 2x/week  PT DURATION: 8 weeks  PLANNED INTERVENTIONS: 97110-Therapeutic exercises, 97530- Therapeutic activity, [id number]- Neuromuscular re-education, 97535- Self Care, 40981- Manual therapy, [id number]- Gait training, [phone]- Canalith repositioning, [id number]- Aquatic  Therapy, 97014- Electrical stimulation (unattended), [id number]- Electrical stimulation (manual), 97016- Vasopneumatic device, 97033- Ionotophoresis 4mg /ml Dexamethasone, Patient/Family education, Balance training, Stair training, Taping, Dry Needling, Joint mobilization, Cryotherapy, Moist heat, and Biofeedback  PLAN FOR NEXT SESSION: Progress HEP, slowly add in plyo metric/sport specific activity, IASTM to achilles, DN to Lt gastroc, soleus, anterior tib and add posterior tib, if patient has good response, manual to Lt achilles and associated soft tissue, proprioception   Shamaria Kavan B. Aubrynn Katona, PT 08/29/23 11:10 AM [facility] [address], [address] [place], [place] [address] Phone # [phone] Fax [phone]

## 2023-09-02 ENCOUNTER — Encounter: Payer: Self-pay | Admitting: Rehabilitative and Restorative Service Providers"

## 2023-09-02 ENCOUNTER — Ambulatory Visit: Payer: 59 | Admitting: Rehabilitative and Restorative Service Providers"

## 2023-09-02 DIAGNOSIS — M25572 Pain in left ankle and joints of left foot: Secondary | ICD-10-CM

## 2023-09-02 DIAGNOSIS — R252 Cramp and spasm: Secondary | ICD-10-CM

## 2023-09-02 DIAGNOSIS — R262 Difficulty in walking, not elsewhere classified: Secondary | ICD-10-CM

## 2023-09-02 DIAGNOSIS — M6281 Muscle weakness (generalized): Secondary | ICD-10-CM

## 2023-09-02 NOTE — Therapy (Signed)
 OUTPATIENT PHYSICAL THERAPY TREATMENT NOTE   Patient Name: Thomas Ballard MRN: [id number] DOB:04/24/1961, 63 y.o., male Today's Date: 09/02/2023  END OF SESSION:  PT End of Session - 09/02/23 0847     Visit Number 7    Date for PT Re-Evaluation 10/04/23    Authorization Type [employer]    PT Start Time 0845    PT Stop Time 0925    PT Time Calculation (min) 40 min    Activity Tolerance Patient tolerated treatment well    Behavior During Therapy WFL for tasks assessed/performed             Past Medical History:  Diagnosis Date   DM (diabetes mellitus), type 2 with complications (HCC)    Gout    Hyperlipidemia    Hypertension    Hypothyroidism    OSA (obstructive sleep apnea)    PSVT (paroxysmal supraventricular tachycardia) (HCC)    Sleep apnea    Past Surgical History:  Procedure Laterality Date   BACK SURGERY  1983   There are no active problems to display for this patient.   PCP: Peri Maris, FNP  REFERRING PROVIDER: Sharl Ma, DPM  REFERRING DIAG:  [phone] (ICD-10-CM) - Gastrocnemius equinus of left lower extremity  THERAPY DIAG:  Pain in left ankle and joints of left foot  Difficulty in walking, not elsewhere classified  Muscle weakness (generalized)  Cramp and spasm  Rationale for Evaluation and Treatment: Rehabilitation  ONSET DATE: 08/08/2023  SUBJECTIVE:   SUBJECTIVE STATEMENT: Patient reports that he is overall feeling better.  States that weakness is improving.  From MD note:  The patient, with a history of diabetes and previous back surgery, presents with a long-standing sensation in the left foot, described as 'bubble wrap,' which has now extended to the right foot. This sensation has been present for approximately five to six years. The patient also reports a feeling of 'gout' in the right big toe, without the severe pain typically associated with gout. The patient experiences intermittent 'weird' feelings when dragging the left  foot on bed sheets, which do not persist. The patient is concerned about the progression of these symptoms and is seeking ways to prevent further deterioration.   PERTINENT HISTORY: na  PAIN:  Are you having pain? Yes: NPRS scale: currently 0-1/10 Pain location: left achilles Pain description: tightness Aggravating factors: tennis, excessive walking (has to fly for work and having to walk rapidly through airport) Relieving factors: Rest, ice  PRECAUTIONS: None  RED FLAGS: None   WEIGHT BEARING RESTRICTIONS: No  FALLS:  Has patient fallen in last 6 months? No  LIVING ENVIRONMENT: Lives with: lives with their spouse Lives in: House/apartment  OCCUPATION: Sales rep, mostly desk, some traveling  PLOF: Independent, Independent with basic ADLs, Independent with household mobility without device, Independent with community mobility without device, Independent with homemaking with ambulation, Independent with gait, and Independent with transfers  PATIENT GOALS: He hopes to be able to recover fully and return to golf and tennis.   NEXT MD VISIT: na  OBJECTIVE:  Note: Objective measures were completed at Evaluation unless otherwise noted.  DIAGNOSTIC FINDINGS:  MRI of left heel on 08/17/2023: IMPRESSION: Partial high-grade non full-thickness tear of the Achilles tendon 3.4 cm from the insertion.  PATIENT SURVEYS:  08/16/2023:  LEFS  56 / 80 = 70.0 % 09/02/2023:  Lower Extremity Functional Score: 58 / 80 = 72.5 %  COGNITION: Overall cognitive status: Within functional limits for tasks assessed  SENSATION: WFL   POSTURE:  left foot pronated > right  PALPATION: Non tender and medial and lateral stabilizers, minimal tenderness over left achilles  LOWER EXTREMITY ROM:  Active ROM Right eval Left eval Left 09/02/23  Ankle dorsiflexion 18 14 14   Ankle plantarflexion 52 42 64  Ankle inversion 20 18 35  Ankle eversion 12 22 8    (Blank rows = not tested)  LOWER  EXTREMITY MMT:  All generally 5/5 with exception of left ankle eversion 4+/5  LOWER EXTREMITY SPECIAL TESTS:  Ankle special tests: Thompson's test: negative  FUNCTIONAL TESTS:  08/16/2023 5 times sit to stand: 11.22 sec Timed up and go (TUG): 7.41 sec Single leg stance:  right- 21.54 sec, left- 4.31 sec  GAIT: Distance walked: 30 feet Assistive device utilized: None Level of assistance: Complete Independence Comments: antalgic                                                                                                                                TODAY'S TREATMENT   DATE: 09/02/2023 Nustep level 4 x 6 min with PT present to discuss status Rocker board for DF/PF x2 min Gastroc stretch on rocker board propped on balance pad 2 x 30 sec Soleus stretch on rocker board propped on balance pad 2 x 30 sec left LE Tandem walking at barre with UE support as needed down and back x3 laps Inversion/eversion stretch on half roll x40 each direction Lateral band walks with yellow loop 3 laps of 15 feet bilat Seated 4 way ankle strengthening with green tband 2x10 each left ankle Retrogait on treadmill -0.9 mph x3 min Step down on foam pad x10, then on 2 foam pads x10 (left foot on pad, stepping down with right foot) 3D ball toss on rebounder with single leg stance x20 each direction Standing hamstring stretch on Vibration Plate on stretch setting x30 sec bilat   DATE: 08/29/2023 Nustep level 1 x 6 min with PT present to discuss status Rocker board x 2 min Gastroc stretch on rocker board propped on balance pad 3 x 30 sec Soleus stretch on rocker board propped on balance pad 3 x 30 sec Inversion/eversion stretch on half roll x 20 each direction (pausing on eversion for 2-3 stretch) Lateral band walks with yellow loop 3 laps of 15 feet Cone touches 3 x 10 (cone on low mat table, standing on floor) 3 D ball toss at rebounder (standing on floor, red plyo ball) BOSU push offs of involved  (left) foot fwd x 10 and lateral x 10,  BOSU push off with uninvolved trying to stick landing on involved x 10 fwd and lateral Manual: Hawks grip IASTM x 6 min, PROM x 2 min, STM to gastroc x 2 min   DATE: 08/26/2023 Nustep level 1 x 6 min with PT present to discuss status Rocker board x 2 min Gastroc stretch on rocker board propped on balance pad x 10 holding  10 sec each bilateral Soleus stretch on rocker board propped on balance pad x 10 holding 10 sec each left only Inversion/eversion stretch on half roll x 20 each direction Cone touches 3 x 10 (cone on back counter, standing on floor) Lateral band walks with yellow loop 3 laps of 15 feet Step up and hold on airex x 10 fwd and x 10 lateral Manual: Hawks grip IASTM x 6 min, PROM x 2 min, STM to gastroc x 2 min    PATIENT EDUCATION:  Education details: Initiated HEP Person educated: Patient Education method: Programmer, multimedia, Facilities manager, and Handouts Education comprehension: verbalized understanding and returned demonstration  HOME EXERCISE PROGRAM: Access Code: [email or web address] URL: [email or web address] Date: 08/16/2023 Prepared by: Clydie Braun Xandra Laramee  Exercises - Gastroc Stretch on Wall  - 1 x daily - 7 x weekly - 2 reps - 30 sec hold - Soleus Stretch on Wall  - 1 x daily - 7 x weekly - 2 reps - 30 sec hold - Single Leg Stance with Support  - 1 x daily - 7 x weekly - 2 reps - 20 sec hold - Seated Heel Toe Raises  - 1 x daily - 7 x weekly - 2 sets - 10 reps - Seated Ankle Alphabet  - 1 x daily - 7 x weekly - 1-2 reps - Seated Ankle Circles  - 1 x daily - 7 x weekly - 2 sets - 10 reps - Towel Scrunches  - 1 x daily - 7 x weekly - 2 sets - 10 reps - Seated Heel Raise  - 1 x daily - 7 x weekly - 2 sets - 3 reps - 10 sec hold - Standing Heel Raises  - 1 x daily - 7 x weekly - 2 sets - 3 reps - 20 sec hold - Eccentric Heel Lowering on Step  - 1 x daily - 7 x weekly - 10 reps   ASSESSMENT:  CLINICAL IMPRESSION: Mr Eggenberger is  progressing with slight improvement on Lower Extremity Functional scale.  Patient with improvements noted to left ankle range of motion, as well.  Patient continues to have difficulty with balance tasks, such as tandem gait and single leg stance rebounder.  Added hamstring stretch today on Power Plate to assist with improved general lower extremity mobility.  Patient continues to require skilled PT to progress towards goal related activities.  OBJECTIVE IMPAIRMENTS: difficulty walking, decreased ROM, decreased strength, increased fascial restrictions, increased muscle spasms, impaired flexibility, postural dysfunction, and pain.   ACTIVITY LIMITATIONS: squatting, stairs, and transfers  PARTICIPATION LIMITATIONS: cleaning, driving, shopping, community activity, occupation, and yard work  PERSONAL FACTORS: Profession and Time since onset of injury/illness/exacerbation are also affecting patient's functional outcome.   REHAB POTENTIAL: Good  CLINICAL DECISION MAKING: Stable/uncomplicated  EVALUATION COMPLEXITY: Low   GOALS: Goals reviewed with patient? Yes  SHORT TERM GOALS: Target date: 09/06/2023   Be independent in initial HEP Baseline: Goal status: Met on 08/23/2023  2.  Be able to discontinue boot Baseline:  Goal status: Ongoing  LONG TERM GOALS: Target date: 10/04/2023   Be independent in advanced HEP Baseline:  Goal status: Ongoing  2.  Improve LEFS by 3-5 points  Baseline:  Goal status: Ongoing  3.  Be able to resume tennis and golf Baseline:  Goal status: INITIAL  4.  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  Be able to do single leg heel raise on left x 5  Baseline:  Goal status: INITIAL    PLAN:  PT FREQUENCY: 2x/week  PT DURATION: 8 weeks  PLANNED INTERVENTIONS: 97110-Therapeutic exercises, 97530- Therapeutic activity, [id number]- Neuromuscular re-education, 97535- Self Care, 16109- Manual  therapy, [id number]- Gait training, [phone]- Canalith repositioning, [id number]- Aquatic Therapy, 97014- Electrical stimulation (unattended), [phone]- Electrical stimulation (manual), 97016- Vasopneumatic device, 97033- Ionotophoresis 4mg /ml Dexamethasone, Patient/Family education, Balance training, Stair training, Taping, Dry Needling, Joint mobilization, Cryotherapy, Moist heat, and Biofeedback  PLAN FOR NEXT SESSION: Progress HEP, slowly add in plyo metric/sport specific activity, IASTM to achilles, DN to Lt gastroc, soleus, anterior tib and add posterior tib, if patient has good response, manual to Lt achilles and associated soft tissue, proprioception   Reather Laurence, PT, DPT 09/02/23, 9:30 AM  [facility] [address], [address] [place], [place] [address] Phone # [phone] Fax [phone]

## 2023-09-06 ENCOUNTER — Ambulatory Visit: Payer: 59 | Admitting: Rehabilitative and Restorative Service Providers"

## 2023-09-06 ENCOUNTER — Encounter: Payer: Self-pay | Admitting: Rehabilitative and Restorative Service Providers"

## 2023-09-06 DIAGNOSIS — R262 Difficulty in walking, not elsewhere classified: Secondary | ICD-10-CM

## 2023-09-06 DIAGNOSIS — M25572 Pain in left ankle and joints of left foot: Secondary | ICD-10-CM

## 2023-09-06 DIAGNOSIS — R252 Cramp and spasm: Secondary | ICD-10-CM

## 2023-09-06 DIAGNOSIS — M6281 Muscle weakness (generalized): Secondary | ICD-10-CM

## 2023-09-06 NOTE — Therapy (Signed)
 OUTPATIENT PHYSICAL THERAPY TREATMENT NOTE   Patient Name: Thomas Ballard MRN: [id number] DOB:12/30/1960, 63 y.o., male Today's Date: 09/06/2023  END OF SESSION:  PT End of Session - 09/06/23 0805     Visit Number 8    Date for PT Re-Evaluation 10/04/23    Authorization Type [employer]    Progress Note Due on Visit 10    PT Start Time 0800    PT Stop Time 0840    PT Time Calculation (min) 40 min    Activity Tolerance Patient tolerated treatment well    Behavior During Therapy WFL for tasks assessed/performed             Past Medical History:  Diagnosis Date   DM (diabetes mellitus), type 2 with complications (HCC)    Gout    Hyperlipidemia    Hypertension    Hypothyroidism    OSA (obstructive sleep apnea)    PSVT (paroxysmal supraventricular tachycardia) (HCC)    Sleep apnea    Past Surgical History:  Procedure Laterality Date   BACK SURGERY  1983   There are no active problems to display for this patient.   PCP: Peri Maris, FNP  REFERRING PROVIDER: Sharl Ma, DPM  REFERRING DIAG:  [phone] (ICD-10-CM) - Gastrocnemius equinus of left lower extremity  THERAPY DIAG:  Pain in left ankle and joints of left foot  Difficulty in walking, not elsewhere classified  Muscle weakness (generalized)  Cramp and spasm  Rationale for Evaluation and Treatment: Rehabilitation  ONSET DATE: 08/08/2023  SUBJECTIVE:   SUBJECTIVE STATEMENT: Patient reports that he can feel that his ankle is getting stronger, states that he is still using his boot when not exercising.  Does report that he is feeling his ankle getting stronger.  Denies any current pain.      PERTINENT HISTORY: From initial MD note:  The patient, with a history of diabetes and previous back surgery, presents with a long-standing sensation in the left foot, described as 'bubble wrap,' which has now extended to the right foot. This sensation has been present for approximately five to six years. The  patient also reports a feeling of 'gout' in the right big toe, without the severe pain typically associated with gout. The patient experiences intermittent 'weird' feelings when dragging the left foot on bed sheets, which do not persist. The patient is concerned about the progression of these symptoms and is seeking ways to prevent further deterioration.   PAIN:  Are you having pain? Yes: NPRS scale: currently 0-1/10 Pain location: left achilles Pain description: tightness Aggravating factors: tennis, excessive walking (has to fly for work and having to walk rapidly through airport) Relieving factors: Rest, ice  PRECAUTIONS: None  RED FLAGS: None   WEIGHT BEARING RESTRICTIONS: No  FALLS:  Has patient fallen in last 6 months? No  LIVING ENVIRONMENT: Lives with: lives with their spouse Lives in: House/apartment  OCCUPATION: Sales rep, mostly desk, some traveling  PLOF: Independent, Independent with basic ADLs, Independent with household mobility without device, Independent with community mobility without device, Independent with homemaking with ambulation, Independent with gait, and Independent with transfers  PATIENT GOALS: He hopes to be able to recover fully and return to golf and tennis.   NEXT MD VISIT: na  OBJECTIVE:  Note: Objective measures were completed at Evaluation unless otherwise noted.  DIAGNOSTIC FINDINGS:  MRI of left heel on 08/17/2023: IMPRESSION: Partial high-grade non full-thickness tear of the Achilles tendon 3.4 cm from the insertion.  PATIENT SURVEYS:  08/16/2023:  LEFS  56 / 80 = 70.0 % 09/02/2023:  Lower Extremity Functional Score: 58 / 80 = 72.5 %  COGNITION: Overall cognitive status: Within functional limits for tasks assessed     SENSATION: WFL   POSTURE:  left foot pronated > right  PALPATION: Non tender and medial and lateral stabilizers, minimal tenderness over left achilles  LOWER EXTREMITY ROM:  Active ROM Right eval Left eval  Left 09/02/23  Ankle dorsiflexion 18 14 14   Ankle plantarflexion 52 42 64  Ankle inversion 20 18 35  Ankle eversion 12 22 8    (Blank rows = not tested)  LOWER EXTREMITY MMT:  All generally 5/5 with exception of left ankle eversion 4+/5  LOWER EXTREMITY SPECIAL TESTS:  Ankle special tests: Thompson's test: negative  FUNCTIONAL TESTS:  08/16/2023 5 times sit to stand: 11.22 sec Timed up and go (TUG): 7.41 sec Single leg stance:  right- 21.54 sec, left- 4.31 sec  GAIT: Distance walked: 30 feet Assistive device utilized: None Level of assistance: Complete Independence Comments: antalgic                                                                                                                                TODAY'S TREATMENT   DATE: 09/06/2023 Nustep level 4 x 6 min with PT present to discuss status Unilateral heel raise at barre (with UE support) 2x5 bilat Balance on wobble board x1 min (with UE support, as needed) Rocker board for DF/PF x2 min Tandem walking at barre with UE support as needed down and back x3 laps Lateral band walks with yellow loop 3 laps of 15 feet bilat Forward T at barre x10 bilat (with UE support, as needed) 3D ball toss on rebounder with red plyoball single leg stance x20 each direction Rebounder trampoline toss with red plyoball with tandem stance on foam pad x10 bilat Rebounder trampoline toss with red plyoball with kickstand stance on foam pad x20 FWD step ups onto bosu x10 bilat Lateral step up and over and back on bosu x10 Standing hamstring stretch on Power Plate on stretch setting of 30 Hz x30 sec bilat   DATE: 09/02/2023 Nustep level 4 x 6 min with PT present to discuss status Rocker board for DF/PF x2 min Gastroc stretch on rocker board propped on balance pad 2 x 30 sec Soleus stretch on rocker board propped on balance pad 2 x 30 sec left LE Tandem walking at barre with UE support as needed down and back x3 laps Inversion/eversion  stretch on half roll x40 each direction Lateral band walks with yellow loop 3 laps of 15 feet bilat Seated 4 way ankle strengthening with green tband 2x10 each left ankle Retrogait on treadmill -0.9 mph x3 min Step down on foam pad x10, then on 2 foam pads x10 (left foot on pad, stepping down with right foot) 3D ball toss on rebounder with single leg stance x20 each direction Standing  hamstring stretch on Vibration Plate on stretch setting x30 sec bilat   DATE: 08/29/2023 Nustep level 1 x 6 min with PT present to discuss status Rocker board x 2 min Gastroc stretch on rocker board propped on balance pad 3 x 30 sec Soleus stretch on rocker board propped on balance pad 3 x 30 sec Inversion/eversion stretch on half roll x 20 each direction (pausing on eversion for 2-3 stretch) Lateral band walks with yellow loop 3 laps of 15 feet Cone touches 3 x 10 (cone on low mat table, standing on floor) 3 D ball toss at rebounder (standing on floor, red plyo ball) BOSU push offs of involved (left) foot fwd x 10 and lateral x 10,  BOSU push off with uninvolved trying to stick landing on involved x 10 fwd and lateral Manual: Hawks grip IASTM x 6 min, PROM x 2 min, STM to gastroc x 2 min    PATIENT EDUCATION:  Education details: Initiated HEP Person educated: Patient Education method: Programmer, multimedia, Facilities manager, and Handouts Education comprehension: verbalized understanding and returned demonstration  HOME EXERCISE PROGRAM: Access Code: [email or web address] URL: [email or web address] Date: 08/16/2023 Prepared by: Clydie Braun Ajani Rineer  Exercises - Gastroc Stretch on Wall  - 1 x daily - 7 x weekly - 2 reps - 30 sec hold - Soleus Stretch on Wall  - 1 x daily - 7 x weekly - 2 reps - 30 sec hold - Single Leg Stance with Support  - 1 x daily - 7 x weekly - 2 reps - 20 sec hold - Seated Heel Toe Raises  - 1 x daily - 7 x weekly - 2 sets - 10 reps - Seated Ankle Alphabet  - 1 x daily - 7 x weekly - 1-2  reps - Seated Ankle Circles  - 1 x daily - 7 x weekly - 2 sets - 10 reps - Towel Scrunches  - 1 x daily - 7 x weekly - 2 sets - 10 reps - Seated Heel Raise  - 1 x daily - 7 x weekly - 2 sets - 3 reps - 10 sec hold - Standing Heel Raises  - 1 x daily - 7 x weekly - 2 sets - 3 reps - 20 sec hold - Eccentric Heel Lowering on Step  - 1 x daily - 7 x weekly - 10 reps   ASSESSMENT:  CLINICAL IMPRESSION: Mr Hynes is reports that he continues to wear his left boot most of the day, when not exercising.  Educated patient about trying to wean off his boot more throughout the day, as his body tolerates.  Patient educated that he could go to the driving range to try gently hitting some of the golf balls.  Progressed during session with improved balance tasks.  Patient is hoping to progress more towards continued dynamic tasks to allow him to return to playing tennis.  OBJECTIVE IMPAIRMENTS: difficulty walking, decreased ROM, decreased strength, increased fascial restrictions, increased muscle spasms, impaired flexibility, postural dysfunction, and pain.   ACTIVITY LIMITATIONS: squatting, stairs, and transfers  PARTICIPATION LIMITATIONS: cleaning, driving, shopping, community activity, occupation, and yard work  PERSONAL FACTORS: Profession and Time since onset of injury/illness/exacerbation are also affecting patient's functional outcome.   REHAB POTENTIAL: Good  CLINICAL DECISION MAKING: Stable/uncomplicated  EVALUATION COMPLEXITY: Low   GOALS: Goals reviewed with patient? Yes  SHORT TERM GOALS: Target date: 09/06/2023   Be independent in initial HEP Baseline: Goal status: Met on 08/23/2023  2.  Be able to discontinue boot Baseline:  Goal status: Ongoing (on 09/06/23, patient still reports using the boot 8 hours a day)  LONG TERM GOALS: Target date: 10/04/2023   Be independent in advanced HEP Baseline:  Goal status: Ongoing  2.  Improve LEFS by 3-5 points  Baseline:  Goal status:  Ongoing  3.  Be able to resume tennis and golf Baseline:  Goal status: INITIAL  4.  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  Be able to do single leg heel raise on left x 5 Baseline:  Goal status: INITIAL    PLAN:  PT FREQUENCY: 2x/week  PT DURATION: 8 weeks  PLANNED INTERVENTIONS: 97110-Therapeutic exercises, 97530- Therapeutic activity, [id number]- Neuromuscular re-education, 97535- Self Care, 60454- Manual therapy, [phone]- Gait training, [phone]- Canalith repositioning, [id number]- Aquatic Therapy, 97014- Electrical stimulation (unattended), [id number]- Electrical stimulation (manual), 97016- Vasopneumatic device, [id number]- Ionotophoresis 4mg /ml Dexamethasone, Patient/Family education, Balance training, Stair training, Taping, Dry Needling, Joint mobilization, Cryotherapy, Moist heat, and Biofeedback  PLAN FOR NEXT SESSION: Progress HEP, start progression towards dynamic tasks to allow patient to start resuming towards tennis   Reather Laurence, PT, DPT 09/06/23, 10:17 AM  [facility] [address] [place], [place] [address] Phone # [phone] Fax [phone]

## 2023-09-09 ENCOUNTER — Ambulatory Visit: Payer: 59 | Admitting: Physical Therapy

## 2023-09-09 DIAGNOSIS — M25572 Pain in left ankle and joints of left foot: Secondary | ICD-10-CM | POA: Diagnosis not present

## 2023-09-09 DIAGNOSIS — R262 Difficulty in walking, not elsewhere classified: Secondary | ICD-10-CM

## 2023-09-09 DIAGNOSIS — M6281 Muscle weakness (generalized): Secondary | ICD-10-CM

## 2023-09-09 NOTE — Therapy (Signed)
 OUTPATIENT PHYSICAL THERAPY TREATMENT NOTE   Patient Name: Thomas Ballard MRN: [id number] DOB:April 11, 1961, 63 y.o., male Today's Date: 09/09/2023  END OF SESSION:  PT End of Session - 09/09/23 0758     Visit Number 9    Date for PT Re-Evaluation 10/04/23    Authorization Type U[employer]    Progress Note Due on Visit 10    PT Start Time 0800    PT Stop Time 0840    PT Time Calculation (min) 40 min    Activity Tolerance Patient tolerated treatment well             Past Medical History:  Diagnosis Date   DM (diabetes mellitus), type 2 with complications (HCC)    Gout    Hyperlipidemia    Hypertension    Hypothyroidism    OSA (obstructive sleep apnea)    PSVT (paroxysmal supraventricular tachycardia) (HCC)    Sleep apnea    Past Surgical History:  Procedure Laterality Date   BACK SURGERY  1983   There are no active problems to display for this patient.   PCP: Peri Maris, FNP  REFERRING PROVIDER: Sharl Ma, DPM  REFERRING DIAG:  [phone] (ICD-10-CM) - Gastrocnemius equinus of left lower extremity  THERAPY DIAG:  Pain in left ankle and joints of left foot  Difficulty in walking, not elsewhere classified  Muscle weakness (generalized)  Rationale for Evaluation and Treatment: Rehabilitation  ONSET DATE: 08/08/2023  SUBJECTIVE:   SUBJECTIVE STATEMENT: This week I started to wear the boot less. Went to the driving range and hit golf balls for 20 min.  Some burning in Achilles 1-2/10. My goal is to play [employer] end of March.  Going to the gym but not doing leg things.    Goes by [name] HISTORY: From initial MD note:  The patient, with a history of diabetes and previous back surgery, presents with a long-standing sensation in the left foot, described as 'bubble wrap,' which has now extended to the right foot. This sensation has been present for approximately five to six years. The patient also reports a feeling of 'gout' in the right big toe, without  the severe pain typically associated with gout. The patient experiences intermittent 'weird' feelings when dragging the left foot on bed sheets, which do not persist. The patient is concerned about the progression of these symptoms and is seeking ways to prevent further deterioration.   PAIN:  Are you having pain? Yes: NPRS scale: currently  today 0/10 Pain location: left achilles Pain description: tightness Aggravating factors: tennis, excessive walking (has to fly for work and having to walk rapidly through airport) Relieving factors: Rest, ice  PRECAUTIONS: None  RED FLAGS: None   WEIGHT BEARING RESTRICTIONS: No  FALLS:  Has patient fallen in last 6 months? No  LIVING ENVIRONMENT: Lives with: lives with their spouse Lives in: House/apartment  OCCUPATION: Sales rep, mostly desk, some traveling  PLOF: Independent, Independent with basic ADLs, Independent with household mobility without device, Independent with community mobility without device, Independent with homemaking with ambulation, Independent with gait, and Independent with transfers  PATIENT GOALS: He hopes to be able to recover fully and return to golf and tennis.   NEXT MD VISIT: na  OBJECTIVE:  Note: Objective measures were completed at Evaluation unless otherwise noted.  DIAGNOSTIC FINDINGS:  MRI of left heel on 08/17/2023: IMPRESSION: Partial high-grade non full-thickness tear of the Achilles tendon 3.4 cm from the insertion.  PATIENT SURVEYS:  08/16/2023:  LEFS  56 / 80 = 70.0 % 09/02/2023:  Lower Extremity Functional Score: 58 / 80 = 72.5 %  COGNITION: Overall cognitive status: Within functional limits for tasks assessed     SENSATION: WFL   POSTURE:  left foot pronated > right  PALPATION: Non tender and medial and lateral stabilizers, minimal tenderness over left achilles  LOWER EXTREMITY ROM:  Active ROM Right eval Left eval Left 09/02/23  Ankle dorsiflexion 18 14 14   Ankle plantarflexion 52  42 64  Ankle inversion 20 18 35  Ankle eversion 12 22 8    (Blank rows = not tested)  LOWER EXTREMITY MMT:  All generally 5/5 with exception of left ankle eversion 4+/5  LOWER EXTREMITY SPECIAL TESTS:  Ankle special tests: Thompson's test: negative  FUNCTIONAL TESTS:  08/16/2023 5 times sit to stand: 11.22 sec Timed up and go (TUG): 7.41 sec Single leg stance:  right- 21.54 sec, left- 4.31 sec  GAIT: Distance walked: 30 feet Assistive device utilized: None Level of assistance: Complete Independence Comments: antalgic                                                                                                                                TODAY'S TREATMENT  DATE: 09/09/2023 Nustep level 4 x 6 min with PT present to discuss status Pt demo static stretches he is currently doing Dynamic stretch 3 way lunges at the wall Agility: high stepping, side stepping, butt kicks, mini lunges, box shuffle, step with tip toe reach 2 laps each Heel raises in box formation 5x each direction Single leg Calf press 30# 3x 10 3D ball toss against the wall single leg stance x5  each direction Discussion on research on collagen +Vit C 30 min-60 min prior to LE exercise Discussion on LE exercise at the gym to prepare for return to tennis  DATE: 09/06/2023 Nustep level 4 x 6 min with PT present to discuss status Unilateral heel raise at barre (with UE support) 2x5 bilat Balance on wobble board x1 min (with UE support, as needed) Rocker board for DF/PF x2 min Tandem walking at barre with UE support as needed down and back x3 laps Lateral band walks with yellow loop 3 laps of 15 feet bilat Forward T at barre x10 bilat (with UE support, as needed) 3D ball toss on rebounder with red plyoball single leg stance x20 each direction Rebounder trampoline toss with red plyoball with tandem stance on foam pad x10 bilat Rebounder trampoline toss with red plyoball with kickstand stance on foam pad x20 FWD  step ups onto bosu x10 bilat Lateral step up and over and back on bosu x10 Standing hamstring stretch on Power Plate on stretch setting of 30 Hz x30 sec bilat   DATE: 09/02/2023 Nustep level 4 x 6 min with PT present to discuss status Rocker board for DF/PF x2 min Gastroc stretch on rocker board propped on balance pad 2 x 30 sec Soleus stretch  on rocker board propped on balance pad 2 x 30 sec left LE Tandem walking at barre with UE support as needed down and back x3 laps Inversion/eversion stretch on half roll x40 each direction Lateral band walks with yellow loop 3 laps of 15 feet bilat Seated 4 way ankle strengthening with green tband 2x10 each left ankle Retrogait on treadmill -0.9 mph x3 min Step down on foam pad x10, then on 2 foam pads x10 (left foot on pad, stepping down with right foot) 3D ball toss on rebounder with single leg stance x20 each direction Standing hamstring stretch on Vibration Plate on stretch setting x30 sec bilat   DATE: 08/29/2023 Nustep level 1 x 6 min with PT present to discuss status Rocker board x 2 min Gastroc stretch on rocker board propped on balance pad 3 x 30 sec Soleus stretch on rocker board propped on balance pad 3 x 30 sec Inversion/eversion stretch on half roll x 20 each direction (pausing on eversion for 2-3 stretch) Lateral band walks with yellow loop 3 laps of 15 feet Cone touches 3 x 10 (cone on low mat table, standing on floor) 3 D ball toss at rebounder (standing on floor, red plyo ball) BOSU push offs of involved (left) foot fwd x 10 and lateral x 10,  BOSU push off with uninvolved trying to stick landing on involved x 10 fwd and lateral Manual: Hawks grip IASTM x 6 min, PROM x 2 min, STM to gastroc x 2 min    PATIENT EDUCATION:  Education details: Initiated HEP Person educated: Patient Education method: Programmer, multimedia, Facilities manager, and Handouts Education comprehension: verbalized understanding and returned demonstration  HOME  EXERCISE PROGRAM: Access Code: [email or web address] URL: [email or web address] Date: 08/16/2023 Prepared by: Clydie Braun Menke  Exercises - Gastroc Stretch on Wall  - 1 x daily - 7 x weekly - 2 reps - 30 sec hold - Soleus Stretch on Wall  - 1 x daily - 7 x weekly - 2 reps - 30 sec hold - Single Leg Stance with Support  - 1 x daily - 7 x weekly - 2 reps - 20 sec hold - Seated Heel Toe Raises  - 1 x daily - 7 x weekly - 2 sets - 10 reps - Seated Ankle Alphabet  - 1 x daily - 7 x weekly - 1-2 reps - Seated Ankle Circles  - 1 x daily - 7 x weekly - 2 sets - 10 reps - Towel Scrunches  - 1 x daily - 7 x weekly - 2 sets - 10 reps - Seated Heel Raise  - 1 x daily - 7 x weekly - 2 sets - 3 reps - 10 sec hold - Standing Heel Raises  - 1 x daily - 7 x weekly - 2 sets - 3 reps - 20 sec hold - Eccentric Heel Lowering on Step  - 1 x daily - 7 x weekly - 10 reps   ASSESSMENT:  CLINICAL IMPRESSION: Treatment focus on progression of calf strengthening, agility and proprioception needed to meet his goal of returning to his [employer] tennis league at the end of March.  Therapist providing verbal cues to optimize technique with  exercises in order to achieve the greatest benefit. No pain reported during exercises but does feel muscle fatigue as expected with repetition.    OBJECTIVE IMPAIRMENTS: difficulty walking, decreased ROM, decreased strength, increased fascial restrictions, increased muscle spasms, impaired flexibility, postural dysfunction, and pain.   ACTIVITY LIMITATIONS: squatting, stairs, and transfers  PARTICIPATION  LIMITATIONS: cleaning, driving, shopping, community activity, occupation, and yard work  PERSONAL FACTORS: Profession and Time since onset of injury/illness/exacerbation are also affecting patient's functional outcome.   REHAB POTENTIAL: Good  CLINICAL DECISION MAKING: Stable/uncomplicated  EVALUATION COMPLEXITY: Low   GOALS: Goals reviewed with patient? Yes  SHORT TERM GOALS:  Target date: 09/06/2023   Be independent in initial HEP Baseline: Goal status: Met on 08/23/2023  2.  Be able to discontinue boot Baseline:  Goal status: Ongoing (on 09/06/23, patient still reports using the boot 8 hours a day)  LONG TERM GOALS: Target date: 10/04/2023   Be independent in advanced HEP Baseline:  Goal status: Ongoing  2.  Improve LEFS by 3-5 points  Baseline:  Goal status: Ongoing  3.  Be able to resume tennis and golf Baseline:  Goal status: INITIAL  4.  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  Be able to do single leg heel raise on left x 5 Baseline:  Goal status: INITIAL    PLAN:  PT FREQUENCY: 2x/week  PT DURATION: 8 weeks  PLANNED INTERVENTIONS: 97110-Therapeutic exercises, 97530- Therapeutic activity, 97112- Neuromuscular re-education, 97535- Self Care, 78295- Manual therapy, [phone]- Gait training, [phone]- Canalith repositioning, [id number]- Aquatic Therapy, 97014- Electrical stimulation (unattended), [id number]- Electrical stimulation (manual), 97016- Vasopneumatic device, 97033- Ionotophoresis 4mg /ml Dexamethasone, Patient/Family education, Balance training, Stair training, Taping, Dry Needling, Joint mobilization, Cryotherapy, Moist heat, and Biofeedback  PLAN FOR NEXT SESSION: Progress HEP, progression towards dynamic tasks to allow patient to start resuming towards tennis; end of March match play  Lavinia Sharps, PT 09/09/23 12:28 PM Phone: [phone] Fax: [phone]  [facility] [address], [address] [place], [place] [address] Phone # [phone] Fax [phone]

## 2023-09-13 ENCOUNTER — Ambulatory Visit: Payer: 59 | Attending: Podiatry | Admitting: Physical Therapy

## 2023-09-13 DIAGNOSIS — R252 Cramp and spasm: Secondary | ICD-10-CM | POA: Diagnosis present

## 2023-09-13 DIAGNOSIS — M25572 Pain in left ankle and joints of left foot: Secondary | ICD-10-CM | POA: Insufficient documentation

## 2023-09-13 DIAGNOSIS — R293 Abnormal posture: Secondary | ICD-10-CM | POA: Diagnosis present

## 2023-09-13 DIAGNOSIS — R262 Difficulty in walking, not elsewhere classified: Secondary | ICD-10-CM | POA: Insufficient documentation

## 2023-09-13 DIAGNOSIS — M6281 Muscle weakness (generalized): Secondary | ICD-10-CM | POA: Diagnosis present

## 2023-09-13 NOTE — Therapy (Signed)
 OUTPATIENT PHYSICAL THERAPY TREATMENT NOTE   Patient Name: Thomas Ballard MRN: [id number] DOB:1960-12-06, 63 y.o., male Today's Date: 09/13/2023  END OF SESSION:  PT End of Session - 09/13/23 0758     Visit Number 10    Date for PT Re-Evaluation 10/04/23    Authorization Type UHC    PT Start Time 0759    PT Stop Time 0840    PT Time Calculation (min) 41 min    Activity Tolerance Patient tolerated treatment well             Past Medical History:  Diagnosis Date   DM (diabetes mellitus), type 2 with complications (HCC)    Gout    Hyperlipidemia    Hypertension    Hypothyroidism    OSA (obstructive sleep apnea)    PSVT (paroxysmal supraventricular tachycardia) (HCC)    Sleep apnea    Past Surgical History:  Procedure Laterality Date   BACK SURGERY  1983   There are no active problems to display for this patient.   PCP: Peri Maris, FNP  REFERRING PROVIDER: Sharl Ma, DPM  REFERRING DIAG:  [phone] (ICD-10-CM) - Gastrocnemius equinus of left lower extremity  THERAPY DIAG:  Pain in left ankle and joints of left foot  Difficulty in walking, not elsewhere classified  Muscle weakness (generalized)  Rationale for Evaluation and Treatment: Rehabilitation  ONSET DATE: 08/08/2023  SUBJECTIVE:   SUBJECTIVE STATEMENT: My hips are lot more sore.  I notice it getting off the bike.  Went to the gym late afternoon yesterday.  Eccentric lowers I felt it.  Plans to watch his tennis league play tonight.   Goes by [name] HISTORY: From initial MD note:  The patient, with a history of diabetes and previous back surgery, presents with a long-standing sensation in the left foot, described as 'bubble wrap,' which has now extended to the right foot. This sensation has been present for approximately five to six years. The patient also reports a feeling of 'gout' in the right big toe, without the severe pain typically associated with gout. The patient  experiences intermittent 'weird' feelings when dragging the left foot on bed sheets, which do not persist. The patient is concerned about the progression of these symptoms and is seeking ways to prevent further deterioration.   PAIN:  Are you having pain? Yes: NPRS scale: currently  today 0/10 Pain location: left achilles Pain description: tightness Aggravating factors: tennis, excessive walking (has to fly for work and having to walk rapidly through airport) Relieving factors: Rest, ice  PRECAUTIONS: None  RED FLAGS: None   WEIGHT BEARING RESTRICTIONS: No  FALLS:  Has patient fallen in last 6 months? No  LIVING ENVIRONMENT: Lives with: lives with their spouse Lives in: House/apartment  OCCUPATION: Sales rep, mostly desk, some traveling  PLOF: Independent, Independent with basic ADLs, Independent with household mobility without device, Independent with community mobility without device, Independent with homemaking with ambulation, Independent with gait, and Independent with transfers  PATIENT GOALS: He hopes to be able to recover fully and return to golf and tennis.   NEXT MD VISIT: na  OBJECTIVE:  Note: Objective measures were completed at Evaluation unless otherwise noted.  DIAGNOSTIC FINDINGS:  MRI of left heel on 08/17/2023: IMPRESSION: Partial high-grade non full-thickness tear of the Achilles tendon 3.4 cm from the insertion.  PATIENT SURVEYS:  08/16/2023:  LEFS  56 / 80 = 70.0 % 09/02/2023:  Lower Extremity Functional Score: 58 / 80 = 72.5 %  COGNITION: Overall cognitive status: Within functional limits for tasks assessed     SENSATION: WFL   POSTURE:  left foot pronated > right  PALPATION: Non tender and medial and lateral stabilizers, minimal tenderness over left achilles  LOWER EXTREMITY ROM:  Active ROM Right eval Left eval Left 09/02/23  Ankle dorsiflexion 18 14 14   Ankle plantarflexion 52 42 64  Ankle inversion 20 18 35  Ankle eversion 12 22 8     (Blank rows = not tested)  LOWER EXTREMITY MMT:  All generally 5/5 with exception of left ankle eversion 4+/5  LOWER EXTREMITY SPECIAL TESTS:  Ankle special tests: Thompson's test: negative  FUNCTIONAL TESTS:  08/16/2023 5 times sit to stand: 11.22 sec Timed up and go (TUG): 7.41 sec Single leg stance:  right- 21.54 sec, left- 4.31 sec  GAIT: Distance walked: 30 feet Assistive device utilized: None Level of assistance: Complete Independence Comments: antalgic                                                                                                                                TODAY'S TREATMENT  DATE: 09/13/2023 Bike L2 6 min with PT present to discuss status Discussed just hitting some tennis balls non-competitively Rocker board PF/DF 2-3 min Agility ladder quick steps in/out forward and laterally Staying on toes with lateral stepping, forward and back Wall sits with heel raises 15x Hip hinge with golf club 10x Dead lift with pair of 10# dumbbells just below knee level (mod cues to inc hip hinge and decrease knee flexion) 10# Squat 10# 10x Single leg RDL 10x Therapeutic activity for return to tennis  DATE: 09/09/2023 Nustep level 4 x 6 min with PT present to discuss status Pt demo static stretches he is currently doing Dynamic stretch 3 way lunges at the wall Agility: high stepping, side stepping, butt kicks, mini lunges, box shuffle, step with tip toe reach 2 laps each Heel raises in box formation 5x each direction Single leg Calf press 30# 3x 10 3D ball toss against the wall single leg stance x5  each direction Discussion on research on collagen +Vit C 30 min-60 min prior to LE exercise Discussion on LE exercise at the gym to prepare for return to tennis  DATE: 09/06/2023 Nustep level 4 x 6 min with PT present to discuss status Unilateral heel raise at barre (with UE support) 2x5 bilat Balance on wobble board x1 min (with UE support, as needed) Rocker board for  DF/PF x2 min Tandem walking at barre with UE support as needed down and back x3 laps Lateral band walks with yellow loop 3 laps of 15 feet bilat Forward T at barre x10 bilat (with UE support, as needed) 3D ball toss on rebounder with red plyoball single leg stance x20 each direction Rebounder trampoline toss with red plyoball with tandem stance on foam pad x10 bilat Rebounder trampoline toss with red plyoball with kickstand stance on foam pad  x20 FWD step ups onto bosu x10 bilat Lateral step up and over and back on bosu x10 Standing hamstring stretch on Power Plate on stretch setting of 30 Hz x30 sec bilat   DATE: 09/02/2023 Nustep level 4 x 6 min with PT present to discuss status Rocker board for DF/PF x2 min Gastroc stretch on rocker board propped on balance pad 2 x 30 sec Soleus stretch on rocker board propped on balance pad 2 x 30 sec left LE Tandem walking at barre with UE support as needed down and back x3 laps Inversion/eversion stretch on half roll x40 each direction Lateral band walks with yellow loop 3 laps of 15 feet bilat Seated 4 way ankle strengthening with green tband 2x10 each left ankle Retrogait on treadmill -0.9 mph x3 min Step down on foam pad x10, then on 2 foam pads x10 (left foot on pad, stepping down with right foot) 3D ball toss on rebounder with single leg stance x20 each direction Standing hamstring stretch on Vibration Plate on stretch setting x30 sec bilat   DATE: 08/29/2023 Nustep level 1 x 6 min with PT present to discuss status Rocker board x 2 min Gastroc stretch on rocker board propped on balance pad 3 x 30 sec Soleus stretch on rocker board propped on balance pad 3 x 30 sec Inversion/eversion stretch on half roll x 20 each direction (pausing on eversion for 2-3 stretch) Lateral band walks with yellow loop 3 laps of 15 feet Cone touches 3 x 10 (cone on low mat table, standing on floor) 3 D ball toss at rebounder (standing on floor, red plyo  ball) BOSU push offs of involved (left) foot fwd x 10 and lateral x 10,  BOSU push off with uninvolved trying to stick landing on involved x 10 fwd and lateral Manual: Hawks grip IASTM x 6 min, PROM x 2 min, STM to gastroc x 2 min    PATIENT EDUCATION:  Education details: Initiated HEP Person educated: Patient Education method: Programmer, multimedia, Facilities manager, and Handouts Education comprehension: verbalized understanding and returned demonstration  HOME EXERCISE PROGRAM: Access Code: [email or web address] URL: [email or web address] Date: 08/16/2023 Prepared by: Clydie Braun Menke  Exercises - Gastroc Stretch on Wall  - 1 x daily - 7 x weekly - 2 reps - 30 sec hold - Soleus Stretch on Wall  - 1 x daily - 7 x weekly - 2 reps - 30 sec hold - Single Leg Stance with Support  - 1 x daily - 7 x weekly - 2 reps - 20 sec hold - Seated Heel Toe Raises  - 1 x daily - 7 x weekly - 2 sets - 10 reps - Seated Ankle Alphabet  - 1 x daily - 7 x weekly - 1-2 reps - Seated Ankle Circles  - 1 x daily - 7 x weekly - 2 sets - 10 reps - Towel Scrunches  - 1 x daily - 7 x weekly - 2 sets - 10 reps - Seated Heel Raise  - 1 x daily - 7 x weekly - 2 sets - 3 reps - 10 sec hold - Standing Heel Raises  - 1 x daily - 7 x weekly - 2 sets - 3 reps - 20 sec hold - Eccentric Heel Lowering on Step  - 1 x daily - 7 x weekly - 10 reps   ASSESSMENT:  CLINICAL IMPRESSION: Treatment focus agility and LE strength needed for return to tennis.  Increased challenge level with exercises including more time on the  balls of the feet needed for tennis court coverage.  Exposure to hip hinge/modified dead lift for posterior chain strengthening.  Mod cues and demonstration to encourage more hip hinge and less reliance on knee flexion.   OBJECTIVE IMPAIRMENTS: difficulty walking, decreased ROM, decreased strength, increased fascial restrictions, increased muscle spasms, impaired flexibility, postural dysfunction, and pain.   ACTIVITY  LIMITATIONS: squatting, stairs, and transfers  PARTICIPATION LIMITATIONS: cleaning, driving, shopping, community activity, occupation, and yard work  PERSONAL FACTORS: Profession and Time since onset of injury/illness/exacerbation are also affecting patient's functional outcome.   REHAB POTENTIAL: Good  CLINICAL DECISION MAKING: Stable/uncomplicated  EVALUATION COMPLEXITY: Low   GOALS: Goals reviewed with patient? Yes  SHORT TERM GOALS: Target date: 09/06/2023   Be independent in initial HEP Baseline: Goal status: Met on 08/23/2023  2.  Be able to discontinue boot Baseline:  Goal status: Ongoing (on 09/06/23, patient still reports using the boot 8 hours a day)  LONG TERM GOALS: Target date: 10/04/2023   Be independent in advanced HEP Baseline:  Goal status: Ongoing  2.  Improve LEFS by 3-5 points  Baseline:  Goal status: Ongoing  3.  Be able to resume tennis and golf Baseline:  Goal status: INITIAL  4.  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  Be able to do single leg heel raise on left x 5 Baseline:  Goal status: INITIAL    PLAN:  PT FREQUENCY: 2x/week  PT DURATION: 8 weeks  PLANNED INTERVENTIONS: 97110-Therapeutic exercises, 97530- Therapeutic activity, 97112- Neuromuscular re-education, 97535- Self Care, 96045- Manual therapy, [phone]- Gait training, [phone]- Canalith repositioning, [id number]- Aquatic Therapy, 97014- Electrical stimulation (unattended), [id number]- Electrical stimulation (manual), 97016- Vasopneumatic device, [id number]- Ionotophoresis 4mg /ml Dexamethasone, Patient/Family education, Balance training, Stair training, Taping, Dry Needling, Joint mobilization, Cryotherapy, Moist heat, and Biofeedback  PLAN FOR NEXT SESSION progression towards dynamic tasks to allow patient to resume tennis; end of March match play  Lavinia Sharps, PT 09/13/23 6:54 PM Phone: [phone] Fax:  [phone]  [facility] [address], [address] [place], [place] [address] Phone # [phone] Fax [phone]

## 2023-09-16 ENCOUNTER — Ambulatory Visit: Payer: 59 | Admitting: Physical Therapy

## 2023-09-16 DIAGNOSIS — M25572 Pain in left ankle and joints of left foot: Secondary | ICD-10-CM

## 2023-09-16 DIAGNOSIS — M6281 Muscle weakness (generalized): Secondary | ICD-10-CM

## 2023-09-16 DIAGNOSIS — R262 Difficulty in walking, not elsewhere classified: Secondary | ICD-10-CM

## 2023-09-16 NOTE — Therapy (Signed)
 OUTPATIENT PHYSICAL THERAPY TREATMENT NOTE   Patient Name: Thomas Ballard MRN: [id number] DOB:January 12, 1961, 63 y.o., male Today's Date: 09/16/2023  END OF SESSION:  PT End of Session - 09/16/23 0758     Visit Number 11    Date for PT Re-Evaluation 10/04/23    Authorization Type U[employer]    PT Start Time 0755    PT Stop Time 0835    PT Time Calculation (min) 40 min    Activity Tolerance Patient tolerated treatment well             Past Medical History:  Diagnosis Date   DM (diabetes mellitus), type 2 with complications (HCC)    Gout    Hyperlipidemia    Hypertension    Hypothyroidism    OSA (obstructive sleep apnea)    PSVT (paroxysmal supraventricular tachycardia) (HCC)    Sleep apnea    Past Surgical History:  Procedure Laterality Date   BACK SURGERY  1983   There are no active problems to display for this patient.   PCP: Peri Maris, FNP  REFERRING PROVIDER: Sharl Ma, DPM  REFERRING DIAG:  [phone] (ICD-10-CM) - Gastrocnemius equinus of left lower extremity  THERAPY DIAG:  Pain in left ankle and joints of left foot  Difficulty in walking, not elsewhere classified  Muscle weakness (generalized)  Rationale for Evaluation and Treatment: Rehabilitation  ONSET DATE: 08/08/2023  SUBJECTIVE:   SUBJECTIVE STATEMENT: Some discomfort in hip as well as hamstrings/gastroc. I feel it a little some in the achilles.  Goes by [name] HISTORY: From initial MD note:  The patient, with a history of diabetes and previous back surgery, presents with a long-standing sensation in the left foot, described as 'bubble wrap,' which has now extended to the right foot. This sensation has been present for approximately five to six years. The patient also reports a feeling of 'gout' in the right big toe, without the severe pain typically associated with gout. The patient experiences intermittent 'weird' feelings when dragging the left foot on bed sheets, which do not  persist. The patient is concerned about the progression of these symptoms and is seeking ways to prevent further deterioration.   PAIN:  Are you having pain? Yes: NPRS scale: currently  today 0/10 Pain location: left achilles Pain description: tightness Aggravating factors: tennis, excessive walking (has to fly for work and having to walk rapidly through airport) Relieving factors: Rest, ice  PRECAUTIONS: None  RED FLAGS: None   WEIGHT BEARING RESTRICTIONS: No  FALLS:  Has patient fallen in last 6 months? No  LIVING ENVIRONMENT: Lives with: lives with their spouse Lives in: House/apartment  OCCUPATION: Sales rep, mostly desk, some traveling  PLOF: Independent, Independent with basic ADLs, Independent with household mobility without device, Independent with community mobility without device, Independent with homemaking with ambulation, Independent with gait, and Independent with transfers  PATIENT GOALS: He hopes to be able to recover fully and return to golf and tennis.   NEXT MD VISIT: na  OBJECTIVE:  Note: Objective measures were completed at Evaluation unless otherwise noted.  DIAGNOSTIC FINDINGS:  MRI of left heel on 08/17/2023: IMPRESSION: Partial high-grade non full-thickness tear of the Achilles tendon 3.4 cm from the insertion.  PATIENT SURVEYS:  08/16/2023:  LEFS  56 / 80 = 70.0 % 09/02/2023:  Lower Extremity Functional Score: 58 / 80 = 72.5 %  COGNITION: Overall cognitive status: Within functional limits for tasks assessed     SENSATION: WFL   POSTURE:  left foot pronated > right  PALPATION: Non tender and medial and lateral stabilizers, minimal tenderness over left achilles  LOWER EXTREMITY ROM:  Active ROM Right eval Left eval Left 09/02/23  Ankle dorsiflexion 18 14 14   Ankle plantarflexion 52 42 64  Ankle inversion 20 18 35  Ankle eversion 12 22 8    (Blank rows = not tested)  LOWER EXTREMITY MMT:  All generally 5/5 with exception of left  ankle eversion 4+/5  LOWER EXTREMITY SPECIAL TESTS:  Ankle special tests: Thompson's test: negative  FUNCTIONAL TESTS:  08/16/2023 5 times sit to stand: 11.22 sec Timed up and go (TUG): 7.41 sec Single leg stance:  right- 21.54 sec, left- 4.31 sec  GAIT: Distance walked: 30 feet Assistive device utilized: None Level of assistance: Complete Independence Comments: antalgic                                                                                                                                TODAY'S TREATMENT  DATE: 09/16/2023 Bike L3 6 min with PT present to discuss status Gastroc and soleus static stretches 30 sec 3x each Agility: high stepping, side shuffling, butt kicks,  box shuffle, tennis drill forward/back court movement SLS on left/kickstand foot position as needed for balance with Pallof series using green band: press out, press up, trunk rotation 10x facing both ways Single leg Calf press 30# 3x 10 Heels elevated on 2 inch step to load distal Achilles: bil heel raises 15x; attempted single leg heel raises very challenging 7x  Standing on foam Y cone touches 10x Standing lateral to the wall: WB on left, driving knee forward for right hip flexion and rising up on left toes (power move) 10x  Therapeutic activity for return to tennis DATE: 09/13/2023 Bike L2 6 min with PT present to discuss status Discussed just hitting some tennis balls non-competitively Rocker board PF/DF 2-3 min Agility ladder quick steps in/out forward and laterally Staying on toes with lateral stepping, forward and back Wall sits with heel raises 15x Hip hinge with golf club 10x Dead lift with pair of 10# dumbbells just below knee level (mod cues to inc hip hinge and decrease knee flexion) 10# Squat 10# 10x Single leg RDL 10x Therapeutic activity for return to tennis  DATE: 09/09/2023 Nustep level 4 x 6 min with PT present to discuss status Pt demo static stretches he is currently doing Dynamic  stretch 3 way lunges at the wall Agility: high stepping, side stepping, butt kicks, mini lunges, box shuffle, step with tip toe reach 2 laps each Heel raises in box formation 5x each direction Single leg Calf press 30# 3x 10 3D ball toss against the wall single leg stance x5  each direction Discussion on research on collagen +Vit C 30 min-60 min prior to LE exercise Discussion on LE exercise at the gym to prepare for return to tennis  DATE: 09/06/2023 Nustep level 4 x 6 min with  PT present to discuss status Unilateral heel raise at barre (with UE support) 2x5 bilat Balance on wobble board x1 min (with UE support, as needed) Rocker board for DF/PF x2 min Tandem walking at barre with UE support as needed down and back x3 laps Lateral band walks with yellow loop 3 laps of 15 feet bilat Forward T at barre x10 bilat (with UE support, as needed) 3D ball toss on rebounder with red plyoball single leg stance x20 each direction Rebounder trampoline toss with red plyoball with tandem stance on foam pad x10 bilat Rebounder trampoline toss with red plyoball with kickstand stance on foam pad x20 FWD step ups onto bosu x10 bilat Lateral step up and over and back on bosu x10 Standing hamstring stretch on Power Plate on stretch setting of 30 Hz x30 sec bilat    PATIENT EDUCATION:  Education details: Initiated HEP Person educated: Patient Education method: Programmer, multimedia, Facilities manager, and Handouts Education comprehension: verbalized understanding and returned demonstration  HOME EXERCISE PROGRAM: Access Code: [email or web address] URL: [email or web address] Date: 08/16/2023 Prepared by: Clydie Braun Menke  Exercises - Gastroc Stretch on Wall  - 1 x daily - 7 x weekly - 2 reps - 30 sec hold - Soleus Stretch on Wall  - 1 x daily - 7 x weekly - 2 reps - 30 sec hold - Single Leg Stance with Support  - 1 x daily - 7 x weekly - 2 reps - 20 sec hold - Seated Heel Toe Raises  - 1 x daily - 7 x weekly - 2  sets - 10 reps - Seated Ankle Alphabet  - 1 x daily - 7 x weekly - 1-2 reps - Seated Ankle Circles  - 1 x daily - 7 x weekly - 2 sets - 10 reps - Towel Scrunches  - 1 x daily - 7 x weekly - 2 sets - 10 reps - Seated Heel Raise  - 1 x daily - 7 x weekly - 2 sets - 3 reps - 10 sec hold - Standing Heel Raises  - 1 x daily - 7 x weekly - 2 sets - 3 reps - 20 sec hold - Eccentric Heel Lowering on Step  - 1 x daily - 7 x weekly - 10 reps   ASSESSMENT:  CLINICAL IMPRESSION: Significant left hip musculature fatigue noted during and following most ex's indicating glute weakness which may be a contributing factor with Achilles issue. Neuromuscular re-ed for stability on unstable surface and on single leg.  Treatment focus on higher level movements to prepare for competitive tennis at the end of the month.  No Achilles pain, primary complaint is discomfort left lateral hip.   OBJECTIVE IMPAIRMENTS: difficulty walking, decreased ROM, decreased strength, increased fascial restrictions, increased muscle spasms, impaired flexibility, postural dysfunction, and pain.   ACTIVITY LIMITATIONS: squatting, stairs, and transfers  PARTICIPATION LIMITATIONS: cleaning, driving, shopping, community activity, occupation, and yard work  PERSONAL FACTORS: Profession and Time since onset of injury/illness/exacerbation are also affecting patient's functional outcome.   REHAB POTENTIAL: Good  CLINICAL DECISION MAKING: Stable/uncomplicated  EVALUATION COMPLEXITY: Low   GOALS: Goals reviewed with patient? Yes  SHORT TERM GOALS: Target date: 09/06/2023   Be independent in initial HEP Baseline: Goal status: Met on 08/23/2023  2.  Be able to discontinue boot Baseline:  Goal status: Ongoing (on 09/06/23, patient still reports using the boot 8 hours a day)  LONG TERM GOALS: Target date: 10/04/2023   Be independent in advanced HEP Baseline:  Goal status: Ongoing  2.  Improve LEFS by 3-5 points  Baseline:  Goal  status: Ongoing  3.  Be able to resume tennis and golf Baseline:  Goal status: INITIAL  4.  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  Be able to do single leg heel raise on left x 5 Baseline:  Goal status: INITIAL    PLAN:  PT FREQUENCY: 2x/week  PT DURATION: 8 weeks  PLANNED INTERVENTIONS: 97110-Therapeutic exercises, 97530- Therapeutic activity, 97112- Neuromuscular re-education, 97535- Self Care, 16109- Manual therapy, [phone]- Gait training, [phone]- Canalith repositioning, [id number]- Aquatic Therapy, 97014- Electrical stimulation (unattended), [id number]- Electrical stimulation (manual), 97016- Vasopneumatic device, [id number]- Ionotophoresis 4mg /ml Dexamethasone, Patient/Family education, Balance training, Stair training, Taping, Dry Needling, Joint mobilization, Cryotherapy, Moist heat, and Biofeedback  PLAN FOR NEXT SESSION progression towards dynamic tasks to allow patient to resume tennis; end of March match play  Lavinia Sharps, PT 09/16/23 10:54 AM Phone: [phone] Fax: [phone]  [facility] [address] [place], [place] [address] Phone # [phone] Fax [phone]

## 2023-09-20 ENCOUNTER — Encounter: Payer: Self-pay | Admitting: Rehabilitative and Restorative Service Providers"

## 2023-09-20 ENCOUNTER — Ambulatory Visit: Payer: 59 | Admitting: Rehabilitative and Restorative Service Providers"

## 2023-09-20 DIAGNOSIS — M25572 Pain in left ankle and joints of left foot: Secondary | ICD-10-CM

## 2023-09-20 DIAGNOSIS — R262 Difficulty in walking, not elsewhere classified: Secondary | ICD-10-CM

## 2023-09-20 DIAGNOSIS — M6281 Muscle weakness (generalized): Secondary | ICD-10-CM

## 2023-09-20 DIAGNOSIS — R252 Cramp and spasm: Secondary | ICD-10-CM

## 2023-09-20 NOTE — Therapy (Signed)
 OUTPATIENT PHYSICAL THERAPY TREATMENT NOTE   Patient Name: Thomas Ballard MRN: [id number] DOB:10/23/1960, 63 y.o., male Today's Date: 09/20/2023  END OF SESSION:  PT End of Session - 09/20/23 0759     Visit Number 12    Date for PT Re-Evaluation 10/04/23    Authorization Type UHC    Progress Note Due on Visit --    PT Start Time 0800    PT Stop Time 0840    PT Time Calculation (min) 40 min    Activity Tolerance Patient tolerated treatment well    Behavior During Therapy WFL for tasks assessed/performed             Past Medical History:  Diagnosis Date   DM (diabetes mellitus), type 2 with complications (HCC)    Gout    Hyperlipidemia    Hypertension    Hypothyroidism    OSA (obstructive sleep apnea)    PSVT (paroxysmal supraventricular tachycardia) (HCC)    Sleep apnea    Past Surgical History:  Procedure Laterality Date   BACK SURGERY  1983   There are no active problems to display for this patient.   PCP: Peri Maris, FNP  REFERRING PROVIDER: Sharl Ma, DPM  REFERRING DIAG:  [phone] (ICD-10-CM) - Gastrocnemius equinus of left lower extremity  THERAPY DIAG:  Pain in left ankle and joints of left foot  Difficulty in walking, not elsewhere classified  Muscle weakness (generalized)  Cramp and spasm  Rationale for Evaluation and Treatment: Rehabilitation  ONSET DATE: 08/08/2023  SUBJECTIVE:   SUBJECTIVE STATEMENT: Patient reports that he has been hitting some tennis balls.  Goes by [name] HISTORY: From initial MD note:  The patient, with a history of diabetes and previous back surgery, presents with a long-standing sensation in the left foot, described as 'bubble wrap,' which has now extended to the right foot. This sensation has been present for approximately five to six years. The patient also reports a feeling of 'gout' in the right big toe, without the severe pain typically associated with gout. The patient experiences  intermittent 'weird' feelings when dragging the left foot on bed sheets, which do not persist. The patient is concerned about the progression of these symptoms and is seeking ways to prevent further deterioration.   PAIN:  Are you having pain? Yes: NPRS scale: currently  1/10 Pain location: left achilles Pain description: tightness Aggravating factors: tennis, excessive walking (has to fly for work and having to walk rapidly through airport) Relieving factors: Rest, ice  PRECAUTIONS: None  RED FLAGS: None   WEIGHT BEARING RESTRICTIONS: No  FALLS:  Has patient fallen in last 6 months? No  LIVING ENVIRONMENT: Lives with: lives with their spouse Lives in: House/apartment  OCCUPATION: Sales rep, mostly desk, some traveling  PLOF: Independent, Independent with basic ADLs, Independent with household mobility without device, Independent with community mobility without device, Independent with homemaking with ambulation, Independent with gait, and Independent with transfers  PATIENT GOALS: He hopes to be able to recover fully and return to golf and tennis.   NEXT MD VISIT: na  OBJECTIVE:  Note: Objective measures were completed at Evaluation unless otherwise noted.  DIAGNOSTIC FINDINGS:  MRI of left heel on 08/17/2023: IMPRESSION: Partial high-grade non full-thickness tear of the Achilles tendon 3.4 cm from the insertion.  PATIENT SURVEYS:  08/16/2023:  LEFS  56 / 80 = 70.0 % 09/02/2023:  Lower Extremity Functional Score: 58 / 80 = 72.5 %  COGNITION: Overall cognitive status: Within  functional limits for tasks assessed     SENSATION: WFL   POSTURE:  left foot pronated > right  PALPATION: Non tender and medial and lateral stabilizers, minimal tenderness over left achilles  LOWER EXTREMITY ROM:  Active ROM Right eval Left eval Left 09/02/23  Ankle dorsiflexion 18 14 14   Ankle plantarflexion 52 42 64  Ankle inversion 20 18 35  Ankle eversion 12 22 8    (Blank rows = not  tested)  LOWER EXTREMITY MMT:  All generally 5/5 with exception of left ankle eversion 4+/5  LOWER EXTREMITY SPECIAL TESTS:  Ankle special tests: Thompson's test: negative  FUNCTIONAL TESTS:  08/16/2023 5 times sit to stand: 11.22 sec Timed up and go (TUG): 7.41 sec Single leg stance:  right- 21.54 sec, left- 4.31 sec  09/20/2023 Single leg stance:  right- 29.75 sec max, left- 11.85 sec max  GAIT: Distance walked: 30 feet Assistive device utilized: None Level of assistance: Complete Independence Comments: antalgic                                                                                                                                TODAY'S TREATMENT   DATE: 09/20/2023 Bike L3 x6 min with PT present to discuss status Practice single leg stance Tandem stance 2x30 sec with each foot leading 3D ball toss on rebounder with blue plyoball single leg stance x20 each direction Side stepping with braiding pattern 3x 10 ft bilat (slower pace) Quick side step shuffle 4x10 ft bilat Quick shuffles from dot on floor to 2 cones set in a "V" pattern and reaching down to touch each cone 2x5 with recovery period in between Single leg Calf Press (seat at 7) 50# 2x10 left LE Standing left heel raise with toes on 2" step:  2x10 (with UE support) Single leg stance on flat side of half foam bolster 2x30 sec with UE support from barre as needed Standing rocker board for DF/PF x2 min   DATE: 09/16/2023 Bike L3 6 min with PT present to discuss status Gastroc and soleus static stretches 30 sec 3x each Agility: high stepping, side shuffling, butt kicks,  box shuffle, tennis drill forward/back court movement SLS on left/kickstand foot position as needed for balance with Pallof series using green band: press out, press up, trunk rotation 10x facing both ways Single leg Calf press 30# 3x 10 Heels elevated on 2 inch step to load distal Achilles: bil heel raises 15x; attempted single leg heel raises  very challenging 7x  Standing on foam Y cone touches 10x Standing lateral to the wall: WB on left, driving knee forward for right hip flexion and rising up on left toes (power move) 10x  Therapeutic activity for return to tennis   DATE: 09/13/2023 Bike L2 6 min with PT present to discuss status Discussed just hitting some tennis balls non-competitively Rocker board PF/DF 2-3 min Agility ladder quick steps in/out forward and laterally Staying on toes  with lateral stepping, forward and back Wall sits with heel raises 15x Hip hinge with golf club 10x Dead lift with pair of 10# dumbbells just below knee level (mod cues to inc hip hinge and decrease knee flexion) 10# Squat 10# 10x Single leg RDL 10x Therapeutic activity for return to tennis     PATIENT EDUCATION:  Education details: Initiated HEP Person educated: Patient Education method: Programmer, multimedia, Facilities manager, and Handouts Education comprehension: verbalized understanding and returned demonstration  HOME EXERCISE PROGRAM: Access Code: [email or web address] URL: [email or web address] Date: 08/16/2023 Prepared by: Clydie Braun Hadden Steig  Exercises - Gastroc Stretch on Wall  - 1 x daily - 7 x weekly - 2 reps - 30 sec hold - Soleus Stretch on Wall  - 1 x daily - 7 x weekly - 2 reps - 30 sec hold - Single Leg Stance with Support  - 1 x daily - 7 x weekly - 2 reps - 20 sec hold - Seated Heel Toe Raises  - 1 x daily - 7 x weekly - 2 sets - 10 reps - Seated Ankle Alphabet  - 1 x daily - 7 x weekly - 1-2 reps - Seated Ankle Circles  - 1 x daily - 7 x weekly - 2 sets - 10 reps - Towel Scrunches  - 1 x daily - 7 x weekly - 2 sets - 10 reps - Seated Heel Raise  - 1 x daily - 7 x weekly - 2 sets - 3 reps - 10 sec hold - Standing Heel Raises  - 1 x daily - 7 x weekly - 2 sets - 3 reps - 20 sec hold - Eccentric Heel Lowering on Step  - 1 x daily - 7 x weekly - 10 reps   ASSESSMENT:  CLINICAL IMPRESSION: Mr Kleinsasser presents to skilled PT  reporting that he was able to hit some tennis balls solo and with a partner.  Patient reports that he felt some muscle fatigue following, but none during the activity.  Patient with improved time noted on single leg stance today.  Patient continues to progress with agility activities and improved balance during session.  Patient had to perform braiding in a slower pace secondary to decreased coordination.  Patient continues to require skilled PT to progress towards goal related activities and return to tennis and golf.   OBJECTIVE IMPAIRMENTS: difficulty walking, decreased ROM, decreased strength, increased fascial restrictions, increased muscle spasms, impaired flexibility, postural dysfunction, and pain.   ACTIVITY LIMITATIONS: squatting, stairs, and transfers  PARTICIPATION LIMITATIONS: cleaning, driving, shopping, community activity, occupation, and yard work  PERSONAL FACTORS: Profession and Time since onset of injury/illness/exacerbation are also affecting patient's functional outcome.   REHAB POTENTIAL: Good  CLINICAL DECISION MAKING: Stable/uncomplicated  EVALUATION COMPLEXITY: Low   GOALS: Goals reviewed with patient? Yes  SHORT TERM GOALS: Target date: 09/06/2023   Be independent in initial HEP Baseline: Goal status: Met on 08/23/2023  2.  Be able to discontinue boot Baseline:  Goal status: Met  LONG TERM GOALS: Target date: 10/04/2023   Be independent in advanced HEP Baseline:  Goal status: Ongoing  2.  Improve LEFS by 3-5 points  Baseline:  Goal status: Ongoing  3.  Be able to resume tennis and golf Baseline:  Goal status: Ongoing  4.  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  Be able to do single leg heel raise on left x 5 Baseline:  Goal status: Ongoing  PLAN:  PT FREQUENCY: 2x/week  PT DURATION: 8 weeks  PLANNED INTERVENTIONS: 97110-Therapeutic exercises, 97530- Therapeutic  activity, 97112- Neuromuscular re-education, [phone]- Self Care, 69629- Manual therapy, [phone]- Gait training, [id number]- Canalith repositioning, [id number]- Aquatic Therapy, 97014- Electrical stimulation (unattended), [id number]- Electrical stimulation (manual), 97016- Vasopneumatic device, [id number]- Ionotophoresis 4mg /ml Dexamethasone, Patient/Family education, Balance training, Stair training, Taping, Dry Needling, Joint mobilization, Cryotherapy, Moist heat, and Biofeedback  PLAN FOR NEXT SESSION progression towards dynamic tasks to allow patient to resume tennis; end of March match play    Reather Laurence, PT, DPT 09/20/23, 8:44 AM  [facility] [address] [place], [place] [address] Phone # [phone] Fax [phone]

## 2023-09-23 ENCOUNTER — Ambulatory Visit: Payer: 59 | Admitting: Rehabilitative and Restorative Service Providers"

## 2023-09-23 ENCOUNTER — Encounter: Payer: Self-pay | Admitting: Rehabilitative and Restorative Service Providers"

## 2023-09-23 DIAGNOSIS — M6281 Muscle weakness (generalized): Secondary | ICD-10-CM

## 2023-09-23 DIAGNOSIS — M25572 Pain in left ankle and joints of left foot: Secondary | ICD-10-CM

## 2023-09-23 DIAGNOSIS — R252 Cramp and spasm: Secondary | ICD-10-CM

## 2023-09-23 DIAGNOSIS — R262 Difficulty in walking, not elsewhere classified: Secondary | ICD-10-CM

## 2023-09-23 NOTE — Therapy (Signed)
 OUTPATIENT PHYSICAL THERAPY TREATMENT NOTE   Patient Name: Thomas Ballard MRN: [id number] DOB:[birth year], 63 y.o., male Today's Date: 09/23/2023  END OF SESSION:  PT End of Session - 09/23/23 0802     Visit Number 13    Date for PT Re-Evaluation 10/04/23    Authorization Type U[employer]    PT Start Time 0759    PT Stop Time 0840    PT Time Calculation (min) 41 min    Activity Tolerance Patient tolerated treatment well    Behavior During Therapy WFL for tasks assessed/performed             Past Medical History:  Diagnosis Date   DM (diabetes mellitus), type 2 with complications (HCC)    Gout    Hyperlipidemia    Hypertension    Hypothyroidism    OSA (obstructive sleep apnea)    PSVT (paroxysmal supraventricular tachycardia) (HCC)    Sleep apnea    Past Surgical History:  Procedure Laterality Date   BACK SURGERY  1983   There are no active problems to display for this patient.   PCP: Peri Maris, FNP  REFERRING PROVIDER: Sharl Ma, DPM  REFERRING DIAG:  [phone] (ICD-10-CM) - Gastrocnemius equinus of left lower extremity  THERAPY DIAG:  Pain in left ankle and joints of left foot  Difficulty in walking, not elsewhere classified  Muscle weakness (generalized)  Cramp and spasm  Rationale for Evaluation and Treatment: Rehabilitation  ONSET DATE: 08/08/2023  SUBJECTIVE:   SUBJECTIVE STATEMENT: Patient states that he was able to play 15 min of tennis without any increased pain.  States that he was also able to go to the driving range for 30 minutes without increased pain.  Goes by Thomas Ballard PERTINENT HISTORY: From initial MD note:  The patient, with a history of diabetes and previous back surgery, presents with a long-standing sensation in the left foot, described as 'bubble wrap,' which has now extended to the right foot. This sensation has been present for approximately five to six years. The patient also reports a feeling of 'gout' in the right big toe,  without the severe pain typically associated with gout. The patient experiences intermittent 'weird' feelings when dragging the left foot on bed sheets, which do not persist. The patient is concerned about the progression of these symptoms and is seeking ways to prevent further deterioration.   PAIN:  Are you having pain? Yes: NPRS scale: currently  0/10 Pain location: left achilles Pain description: tightness Aggravating factors: tennis, excessive walking (has to fly for work and having to walk rapidly through airport) Relieving factors: Rest, ice  PRECAUTIONS: None  RED FLAGS: None   WEIGHT BEARING RESTRICTIONS: No  FALLS:  Has patient fallen in last 6 months? No  LIVING ENVIRONMENT: Lives with: lives with their spouse Lives in: House/apartment  OCCUPATION: Sales rep, mostly desk, some traveling  PLOF: Independent, Independent with basic ADLs, Independent with household mobility without device, Independent with community mobility without device, Independent with homemaking with ambulation, Independent with gait, and Independent with transfers  PATIENT GOALS: He hopes to be able to recover fully and return to golf and tennis.   NEXT MD VISIT: Dr Lilian Kapur on 09/29/2023  OBJECTIVE:  Note: Objective measures were completed at Evaluation unless otherwise noted.  DIAGNOSTIC FINDINGS:  MRI of left heel on 08/17/2023: IMPRESSION: Partial high-grade non full-thickness tear of the Achilles tendon 3.4 cm from the insertion.  PATIENT SURVEYS:  08/16/2023:  LEFS  56 / 80 = 70.0 %  09/02/2023:  Lower Extremity Functional Score: 58 / 80 = 72.5 %  COGNITION: Overall cognitive status: Within functional limits for tasks assessed     SENSATION: WFL   POSTURE:  left foot pronated > right  PALPATION: Non tender and medial and lateral stabilizers, minimal tenderness over left achilles  LOWER EXTREMITY ROM:  Active ROM Right eval Left eval Left 09/02/23  Ankle dorsiflexion 18 14 14    Ankle plantarflexion 52 42 64  Ankle inversion 20 18 35  Ankle eversion 12 22 8    (Blank rows = not tested)  LOWER EXTREMITY MMT:  All generally 5/5 with exception of left ankle eversion 4+/5  LOWER EXTREMITY SPECIAL TESTS:  Ankle special tests: Thompson's test: negative  FUNCTIONAL TESTS:  08/16/2023 5 times sit to stand: 11.22 sec Timed up and go (TUG): 7.41 sec Single leg stance:  right- 21.54 sec, left- 4.31 sec  09/20/2023 Single leg stance:  right- 29.75 sec max, left- 11.85 sec max  GAIT: Distance walked: 30 feet Assistive device utilized: None Level of assistance: Complete Independence Comments: antalgic                                                                                                                                TODAY'S TREATMENT   DATE: 09/23/2023 Bike L3 x6 min with PT present to discuss status Single leg Calf Press (seat at 7) 50# x10, 55# x10 left LE Side lunge with foot on slider x10 bilat Back lunge with foot on slider x10 bilat Bear plank 2x10 sec Curtsey lunge x10 bilat Step down from 6" step with UE support x10 bilat 3D ball toss on rebounder with blue plyoball single leg stance x20 each direction Retrogait on treadmill -0.9 mph x3 min Forward hop/stride down and back 3x10 ft Skipping down and back 3x10 ft Hip hiking from 2" step 2x10 bilat Supine hamstring stretch with strap x20 sec bilat Supine IT band stretch with strap x20 sec bilat Supine hip adductor stretch with strap x20 sec bilat   DATE: 09/20/2023 Bike L3 x6 min with PT present to discuss status Practice single leg stance Tandem stance 2x30 sec with each foot leading 3D ball toss on rebounder with blue plyoball single leg stance x20 each direction Side stepping with braiding pattern 3x 10 ft bilat (slower pace) Quick side step shuffle 4x10 ft bilat Quick shuffles from dot on floor to 2 cones set in a "V" pattern and reaching down to touch each cone 2x5 with recovery  period in between Single leg Calf Press (seat at 7) 50# 2x10 left LE Standing left heel raise with toes on 2" step:  2x10 (with UE support) Single leg stance on flat side of half foam bolster 2x30 sec with UE support from barre as needed Standing rocker board for DF/PF x2 min   DATE: 09/16/2023 Bike L3 6 min with PT present to discuss status Gastroc and soleus static stretches 30 sec 3x  each Agility: high stepping, side shuffling, butt kicks,  box shuffle, tennis drill forward/back court movement SLS on left/kickstand foot position as needed for balance with Pallof series using green band: press out, press up, trunk rotation 10x facing both ways Single leg Calf press 30# 3x 10 Heels elevated on 2 inch step to load distal Achilles: bil heel raises 15x; attempted single leg heel raises very challenging 7x  Standing on foam Y cone touches 10x Standing lateral to the wall: WB on left, driving knee forward for right hip flexion and rising up on left toes (power move) 10x  Therapeutic activity for return to tennis     PATIENT EDUCATION:  Education details: Initiated HEP Person educated: Patient Education method: Programmer, multimedia, Facilities manager, and Handouts Education comprehension: verbalized understanding and returned demonstration  HOME EXERCISE PROGRAM: Access Code: [email or web address] URL: [email or web address] Date: 09/23/2023 Prepared by: Clydie Braun Fatih Stalvey  Exercises - Gastroc Stretch on Wall  - 1 x daily - 7 x weekly - 2 reps - 30 sec hold - Soleus Stretch on Wall  - 1 x daily - 7 x weekly - 2 reps - 30 sec hold - Single Leg Stance with Support  - 1 x daily - 7 x weekly - 2 reps - 20 sec hold - Standing Heel Raises  - 1 x daily - 7 x weekly - 2 sets - 3 reps - 20 sec hold - Eccentric Heel Lowering on Step  - 1 x daily - 7 x weekly - 10 reps - Crossover Lunge  - 1 x daily - 7 x weekly - 2 sets - 10 reps - Supine Hamstring Stretch with Strap  - 1 x daily - 7 x weekly - 2 reps - 20 sec  hold - Supine ITB Stretch with Strap  - 1 x daily - 7 x weekly - 2 reps - 20 sec hold - Hip Adductors and Hamstring Stretch with Strap  - 1 x daily - 7 x weekly - 2 reps - 20 sec hold  ASSESSMENT:  CLINICAL IMPRESSION: Mr Swett presents to skilled PT reporting that he continues to progress towards performing tennis and golf for longer times.  Patient states that he has not returned to a full swing yet, but he is gradually progressing his swing power/difficulty.  Patient reports that backwards movement did feel more challenging than forward.  Patient with reports of some hip soreness, so added in stretching exercises to HEP with PT noting that patient has increased tightness in bilateral LE musculature.  Patient encouraged to continue to gradually increase the level of his exercises.  Thomas Ballard continues to require skilled PT to progress towards goal related activities.   OBJECTIVE IMPAIRMENTS: difficulty walking, decreased ROM, decreased strength, increased fascial restrictions, increased muscle spasms, impaired flexibility, postural dysfunction, and pain.   ACTIVITY LIMITATIONS: squatting, stairs, and transfers  PARTICIPATION LIMITATIONS: cleaning, driving, shopping, community activity, occupation, and yard work  PERSONAL FACTORS: Profession and Time since onset of injury/illness/exacerbation are also affecting patient's functional outcome.   REHAB POTENTIAL: Good  CLINICAL DECISION MAKING: Stable/uncomplicated  EVALUATION COMPLEXITY: Low   GOALS: Goals reviewed with patient? Yes  SHORT TERM GOALS: Target date: 09/06/2023   Be independent in initial HEP Baseline: Goal status: Met on 08/23/2023  2.  Be able to discontinue boot Baseline:  Goal status: Met  LONG TERM GOALS: Target date: 10/04/2023   Be independent in advanced HEP Baseline:  Goal status: Ongoing  2.  Improve LEFS by 3-5 points  Baseline:  Goal  status: Ongoing  3.  Be able to resume tennis and golf Baseline:   Goal status: Ongoing  4.  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  Be able to do single leg heel raise on left x 5 Baseline:  Goal status: Ongoing    PLAN:  PT FREQUENCY: 2x/week  PT DURATION: 8 weeks  PLANNED INTERVENTIONS: 97110-Therapeutic exercises, 97530- Therapeutic activity, 97112- Neuromuscular re-education, 97535- Self Care, 16109- Manual therapy, [id number]- Gait training, [id number]- Canalith repositioning, [id number]- Aquatic Therapy, 97014- Electrical stimulation (unattended), [id number]- Electrical stimulation (manual), 97016- Vasopneumatic device, [id number]- Ionotophoresis 4mg /ml Dexamethasone, Patient/Family education, Balance training, Stair training, Taping, Dry Needling, Joint mobilization, Cryotherapy, Moist heat, and Biofeedback  PLAN FOR NEXT SESSION progression towards dynamic tasks to allow patient to resume tennis; end of March match play    Reather Laurence, PT, DPT 09/23/23, 8:50 AM  [facility] [address] [place], [place] [address] Phone # [phone] Fax [phone]

## 2023-09-27 ENCOUNTER — Ambulatory Visit: Payer: 59 | Admitting: Rehabilitative and Restorative Service Providers"

## 2023-09-27 ENCOUNTER — Encounter: Payer: Self-pay | Admitting: Rehabilitative and Restorative Service Providers"

## 2023-09-27 DIAGNOSIS — R262 Difficulty in walking, not elsewhere classified: Secondary | ICD-10-CM

## 2023-09-27 DIAGNOSIS — M6281 Muscle weakness (generalized): Secondary | ICD-10-CM

## 2023-09-27 DIAGNOSIS — M25572 Pain in left ankle and joints of left foot: Secondary | ICD-10-CM

## 2023-09-27 DIAGNOSIS — R252 Cramp and spasm: Secondary | ICD-10-CM

## 2023-09-27 NOTE — Therapy (Signed)
 OUTPATIENT PHYSICAL THERAPY TREATMENT NOTE   Patient Name: Thomas Ballard MRN: [id number] DOB:April 24, 1961, 63 y.o., male Today's Date: 09/27/2023  END OF SESSION:  PT End of Session - 09/27/23 0803     Visit Number 14    Date for PT Re-Evaluation 10/04/23    Authorization Type U[employer]    Progress Note Due on Visit 10    PT Start Time 0800    PT Stop Time 0840    PT Time Calculation (min) 40 min    Activity Tolerance Patient tolerated treatment well    Behavior During Therapy WFL for tasks assessed/performed             Past Medical History:  Diagnosis Date   DM (diabetes mellitus), type 2 with complications (HCC)    Gout    Hyperlipidemia    Hypertension    Hypothyroidism    OSA (obstructive sleep apnea)    PSVT (paroxysmal supraventricular tachycardia) (HCC)    Sleep apnea    Past Surgical History:  Procedure Laterality Date   BACK SURGERY  1983   There are no active problems to display for this patient.   PCP: Peri Maris, FNP  REFERRING PROVIDER: Sharl Ma, DPM  REFERRING DIAG:  [phone] (ICD-10-CM) - Gastrocnemius equinus of left lower extremity  THERAPY DIAG:  Pain in left ankle and joints of left foot  Difficulty in walking, not elsewhere classified  Muscle weakness (generalized)  Cramp and spasm  Rationale for Evaluation and Treatment: Rehabilitation  ONSET DATE: 08/08/2023  SUBJECTIVE:   SUBJECTIVE STATEMENT: Patient states that he was able to play 30 min of tennis without any increased pain. States that he got a stretch strap for home.  Goes by [name] HISTORY: From initial MD note:  The patient, with a history of diabetes and previous back surgery, presents with a long-standing sensation in the left foot, described as 'bubble wrap,' which has now extended to the right foot. This sensation has been present for approximately five to six years. The patient also reports a feeling of 'gout' in the right big toe, without the  severe pain typically associated with gout. The patient experiences intermittent 'weird' feelings when dragging the left foot on bed sheets, which do not persist. The patient is concerned about the progression of these symptoms and is seeking ways to prevent further deterioration.   PAIN:  Are you having pain? Yes: NPRS scale: currently  0/10 Pain location: left achilles Pain description: tightness Aggravating factors: tennis, excessive walking (has to fly for work and having to walk rapidly through airport) Relieving factors: Rest, ice  PRECAUTIONS: None  RED FLAGS: None   WEIGHT BEARING RESTRICTIONS: No  FALLS:  Has patient fallen in last 6 months? No  LIVING ENVIRONMENT: Lives with: lives with their spouse Lives in: House/apartment  OCCUPATION: Sales rep, mostly desk, some traveling  PLOF: Independent, Independent with basic ADLs, Independent with household mobility without device, Independent with community mobility without device, Independent with homemaking with ambulation, Independent with gait, and Independent with transfers  PATIENT GOALS: He hopes to be able to recover fully and return to golf and tennis.   NEXT MD VISIT: Dr Lilian Kapur on 09/29/2023  OBJECTIVE:  Note: Objective measures were completed at Evaluation unless otherwise noted.  DIAGNOSTIC FINDINGS:  MRI of left heel on 08/17/2023: IMPRESSION: Partial high-grade non full-thickness tear of the Achilles tendon 3.4 cm from the insertion.  PATIENT SURVEYS:  08/16/2023:  LEFS  56 / 80 = 70.0 %  09/02/2023:  Lower Extremity Functional Score: 58 / 80 = 72.5 % 09/27/2023:  Lower Extremity Functional Score: 77 / 80 = 96.3 %  COGNITION: Overall cognitive status: Within functional limits for tasks assessed     SENSATION: WFL   POSTURE:  left foot pronated > right  PALPATION: Non tender and medial and lateral stabilizers, minimal tenderness over left achilles  LOWER EXTREMITY ROM:  Active ROM Right eval  Left eval Left 09/02/23  Ankle dorsiflexion 18 14 14   Ankle plantarflexion 52 42 64  Ankle inversion 20 18 35  Ankle eversion 12 22 8    (Blank rows = not tested)  LOWER EXTREMITY MMT:  All generally 5/5 with exception of left ankle eversion 4+/5  LOWER EXTREMITY SPECIAL TESTS:  Ankle special tests: Thompson's test: negative  FUNCTIONAL TESTS:  08/16/2023 5 times sit to stand: 11.22 sec Timed up and go (TUG): 7.41 sec Single leg stance:  right- 21.54 sec, left- 4.31 sec  09/20/2023 Single leg stance:  right- 29.75 sec max, left- 11.85 sec max  GAIT: Distance walked: 30 feet Assistive device utilized: None Level of assistance: Complete Independence Comments: antalgic                                                                                                                                TODAY'S TREATMENT   DATE: 09/27/2023 Bike L3 x6 min with PT present to discuss status Supine hamstring stretch with strap 2x20 sec bilat Supine IT band stretch with strap 2x20 sec bilat Supine hip adductor stretch with strap 2x20 sec bilat Side lunge at barre with foot on slider x10 bilat Back lunge at barre with foot on slider x10 bilat Lower Extremity Functional Score: 77 / 80 = 96.3 % Hip hiking from 2" step 2x10 bilat Standing balance on wobble board x1 min 3D ball toss on rebounder with blue plyoball single leg stance x20 each direction Curtsey lunge x10 bilat   DATE: 09/23/2023 Bike L3 x6 min with PT present to discuss status Single leg Calf Press (seat at 7) 50# x10, 55# x10 left LE Side lunge with foot on slider x10 bilat Back lunge with foot on slider x10 bilat Bear plank 2x10 sec Curtsey lunge x10 bilat Step down from 6" step with UE support x10 bilat 3D ball toss on rebounder with blue plyoball single leg stance x20 each direction Retrogait on treadmill -0.9 mph x3 min Forward hop/stride down and back 3x10 ft Skipping down and back 3x10 ft Hip hiking from 2" step  2x10 bilat Supine hamstring stretch with strap x20 sec bilat Supine IT band stretch with strap x20 sec bilat Supine hip adductor stretch with strap x20 sec bilat   DATE: 09/20/2023 Bike L3 x6 min with PT present to discuss status Practice single leg stance Tandem stance 2x30 sec with each foot leading 3D ball toss on rebounder with blue plyoball single leg stance x20 each direction Side stepping with braiding  pattern 3x 10 ft bilat (slower pace) Quick side step shuffle 4x10 ft bilat Quick shuffles from dot on floor to 2 cones set in a "V" pattern and reaching down to touch each cone 2x5 with recovery period in between Single leg Calf Press (seat at 7) 50# 2x10 left LE Standing left heel raise with toes on 2" step:  2x10 (with UE support) Single leg stance on flat side of half foam bolster 2x30 sec with UE support from barre as needed Standing rocker board for DF/PF x2 min    PATIENT EDUCATION:  Education details: Initiated HEP Person educated: Patient Education method: Programmer, multimedia, Facilities manager, and Handouts Education comprehension: verbalized understanding and returned demonstration  HOME EXERCISE PROGRAM: Access Code: [email or web address] URL: [email or web address] Date: 09/23/2023 Prepared by: Clydie Braun Altheia Shafran  Exercises - Gastroc Stretch on Wall  - 1 x daily - 7 x weekly - 2 reps - 30 sec hold - Soleus Stretch on Wall  - 1 x daily - 7 x weekly - 2 reps - 30 sec hold - Single Leg Stance with Support  - 1 x daily - 7 x weekly - 2 reps - 20 sec hold - Standing Heel Raises  - 1 x daily - 7 x weekly - 2 sets - 3 reps - 20 sec hold - Eccentric Heel Lowering on Step  - 1 x daily - 7 x weekly - 10 reps - Crossover Lunge  - 1 x daily - 7 x weekly - 2 sets - 10 reps - Supine Hamstring Stretch with Strap  - 1 x daily - 7 x weekly - 2 reps - 20 sec hold - Supine ITB Stretch with Strap  - 1 x daily - 7 x weekly - 2 reps - 20 sec hold - Hip Adductors and Hamstring Stretch with Strap  -  1 x daily - 7 x weekly - 2 reps - 20 sec hold  ASSESSMENT:  CLINICAL IMPRESSION: Mr Quillin presents to skilled PT reporting that he will have his first flight next Monday.  States that he continues to progress his tennis time and is still hoping to play in the big match at the end of March.  Patient with great improvements on his Lower Extremity Functional Scale and has met that goal.  Patient continues to progress with functional strength and balance/stability on his ankle.  Patient is on track for discharge at end of cert, as anticipated.   OBJECTIVE IMPAIRMENTS: difficulty walking, decreased ROM, decreased strength, increased fascial restrictions, increased muscle spasms, impaired flexibility, postural dysfunction, and pain.   ACTIVITY LIMITATIONS: squatting, stairs, and transfers  PARTICIPATION LIMITATIONS: cleaning, driving, shopping, community activity, occupation, and yard work  PERSONAL FACTORS: Profession and Time since onset of injury/illness/exacerbation are also affecting patient's functional outcome.   REHAB POTENTIAL: Good  CLINICAL DECISION MAKING: Stable/uncomplicated  EVALUATION COMPLEXITY: Low   GOALS: Goals reviewed with patient? Yes  SHORT TERM GOALS: Target date: 09/06/2023   Be independent in initial HEP Baseline: Goal status: Met on 08/23/2023  2.  Be able to discontinue boot Baseline:  Goal status: Met  LONG TERM GOALS: Target date: 10/04/2023   Be independent in advanced HEP Baseline:  Goal status: Ongoing  2.  Improve LEFS by 3-5 points  Baseline:  Goal status: Met on 09/27/2023  3.  Be able to resume tennis and golf Baseline:  Goal status: Ongoing  4.  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  Be able to do single leg heel raise on left x 5 Baseline:  Goal status: Ongoing    PLAN:  PT FREQUENCY: 2x/week  PT DURATION: 8  weeks  PLANNED INTERVENTIONS: 97110-Therapeutic exercises, 97530- Therapeutic activity, 97112- Neuromuscular re-education, 97535- Self Care, 16109- Manual therapy, [phone]- Gait training, [phone]- Canalith repositioning, [id number]- Aquatic Therapy, 97014- Electrical stimulation (unattended), [id number]- Electrical stimulation (manual), 97016- Vasopneumatic device, [id number]- Ionotophoresis 4mg /ml Dexamethasone, Patient/Family education, Balance training, Stair training, Taping, Dry Needling, Joint mobilization, Cryotherapy, Moist heat, and Biofeedback  PLAN FOR NEXT SESSION progression towards dynamic tasks to allow patient to resume tennis; end of March match play    Reather Laurence, PT, DPT 09/27/23, 8:44 AM  [facility] [address] [place], [place] [address] Phone # [phone] Fax [phone]

## 2023-09-29 ENCOUNTER — Encounter: Payer: Self-pay | Admitting: Podiatry

## 2023-09-29 ENCOUNTER — Ambulatory Visit: Payer: 59 | Admitting: Podiatry

## 2023-09-29 DIAGNOSIS — S86012D Strain of left Achilles tendon, subsequent encounter: Secondary | ICD-10-CM

## 2023-09-29 DIAGNOSIS — G629 Polyneuropathy, unspecified: Secondary | ICD-10-CM | POA: Diagnosis not present

## 2023-09-29 NOTE — Progress Notes (Signed)
  Subjective:  Patient ID: Thomas Ballard, male    DOB: 04-26-1961,  MRN: [id number]  Chief Complaint  Patient presents with   Tendonitis    "My foot is doing well.  I have a little tenderness here and there."    63 y.o. male presents with the above complaint. History confirmed with patient.  He is doing much better.  He has 2 sessions of physical therapy remaining.  Says he is a little bit of soreness after tennis and he has been able to play some but this resolves after rest.  Objective:  Physical Exam: warm, good capillary refill, no trophic changes or ulcerative lesions, normal DP and PT pulses, abnormal sensory exam, and left Achilles tendon still has an area of fullness and swelling in the mid substance there is no pain here to palpation good 5 out of 5 strength. Assessment:   1. Partial Achilles tendon tear, left, subsequent encounter   2. Polyneuropathy      Plan:  Patient was evaluated and treated and all questions answered.  Achilles is doing very well he may resume his full activity at this point.  He will complete therapy next week.  He is going to play full from a tennis match this weekend and then plan to compete again next week.  Discussed possibility of recurrence or reinjury.  Continue exercise until 100% pain-free and I recommend he continue stretching and strengthening exercise before activity   We also again discussed his polyneuropathy he does not have any painful symptoms but feelings of tightness in a band and we discussed this is multifactorial age-related neuropathy, diabetic polyneuropathy and related to previous spine issues.  Unfortunate doubt there will be much that can be done to prevent this, we discussed the impact of blood sugar diet exercise on this and he will continue activity as tolerated.  Return if symptoms worsen or fail to improve.

## 2023-09-30 ENCOUNTER — Ambulatory Visit: Payer: 59

## 2023-09-30 DIAGNOSIS — R262 Difficulty in walking, not elsewhere classified: Secondary | ICD-10-CM

## 2023-09-30 DIAGNOSIS — M25572 Pain in left ankle and joints of left foot: Secondary | ICD-10-CM

## 2023-09-30 DIAGNOSIS — R252 Cramp and spasm: Secondary | ICD-10-CM

## 2023-09-30 DIAGNOSIS — M6281 Muscle weakness (generalized): Secondary | ICD-10-CM

## 2023-09-30 DIAGNOSIS — R293 Abnormal posture: Secondary | ICD-10-CM

## 2023-09-30 NOTE — Therapy (Signed)
 OUTPATIENT PHYSICAL THERAPY TREATMENT NOTE   Patient Name: Thomas Ballard MRN: [id number] DOB:11/09/1960, 63 y.o., male Today's Date: 09/30/2023  END OF SESSION:  PT End of Session - 09/30/23 0809     Visit Number 15    Date for PT Re-Evaluation 10/04/23    Authorization Type [employer]    Progress Note Due on Visit 10    PT Start Time 0804    PT Stop Time 0844    PT Time Calculation (min) 40 min    Activity Tolerance Patient tolerated treatment well    Behavior During Therapy WFL for tasks assessed/performed             Past Medical History:  Diagnosis Date   DM (diabetes mellitus), type 2 with complications (HCC)    Gout    Hyperlipidemia    Hypertension    Hypothyroidism    OSA (obstructive sleep apnea)    PSVT (paroxysmal supraventricular tachycardia) (HCC)    Sleep apnea    Past Surgical History:  Procedure Laterality Date   BACK SURGERY  1983   There are no active problems to display for this patient.   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  "I ice after every PT session and after any golf or tennis"  Goes by [name] HISTORY: From initial MD note:  The patient, with a history of diabetes and previous back surgery, presents with a long-standing sensation in the left foot, described as 'bubble wrap,' which has now extended to the right  foot. This sensation has been present for approximately five to six years. The patient also reports a feeling of 'gout' in the right big toe, without the severe pain typically associated with gout. The patient experiences intermittent 'weird' feelings when dragging the left foot on bed sheets, which do not persist. The patient is concerned about the progression of these symptoms and is seeking ways to prevent further deterioration.   PAIN:  Are you having pain? Yes: NPRS scale: currently  0/10 Pain location: left achilles Pain description: tightness Aggravating factors: tennis, excessive walking (has to fly for work and having to walk rapidly through airport) Relieving factors: Rest, ice  PRECAUTIONS: None  RED FLAGS: None   WEIGHT BEARING RESTRICTIONS: No  FALLS:  Has patient fallen in last 6 months? No  LIVING ENVIRONMENT: Lives with: lives with their spouse Lives in: House/apartment  OCCUPATION: Sales rep, mostly desk, some traveling  PLOF: Independent, Independent with basic ADLs, Independent with household mobility without device, Independent with community mobility without device, Independent with homemaking with ambulation, Independent with gait, and Independent with transfers  PATIENT GOALS: He hopes to be able to recover fully and return to golf and tennis.   NEXT MD VISIT: Dr Lilian Kapur on 09/29/2023  OBJECTIVE:  Note: Objective measures were completed at Evaluation unless otherwise noted.  DIAGNOSTIC FINDINGS:  MRI of left heel on 08/17/2023:  IMPRESSION: Partial high-grade non full-thickness tear of the Achilles tendon 3.4 cm from the insertion.  PATIENT SURVEYS:  08/16/2023:  LEFS  56 / 80 = 70.0 % 09/02/2023:  Lower Extremity Functional Score: 58 / 80 = 72.5 % 09/27/2023:  Lower Extremity Functional Score: 77 / 80 = 96.3 %  COGNITION: Overall cognitive status: Within functional limits for tasks assessed     SENSATION: WFL   POSTURE:  left foot pronated >  right  PALPATION: Non tender and medial and lateral stabilizers, minimal tenderness over left achilles  LOWER EXTREMITY ROM:  Active ROM Right eval Left eval Left 09/02/23  Ankle dorsiflexion 18 14 14   Ankle plantarflexion 52 42 64  Ankle inversion 20 18 35  Ankle eversion 12 22 8    (Blank rows = not tested)  LOWER EXTREMITY MMT:  All generally 5/5 with exception of left ankle eversion 4+/5  LOWER EXTREMITY SPECIAL TESTS:  Ankle special tests: Thompson's test: negative  FUNCTIONAL TESTS:  08/16/2023 5 times sit to stand: 11.22 sec Timed up and go (TUG): 7.41 sec Single leg stance:  right- 21.54 sec, left- 4.31 sec  09/20/2023 Single leg stance:  right- 29.75 sec max, left- 11.85 sec max  GAIT: Distance walked: 30 feet Assistive device utilized: None Level of assistance: Complete Independence Comments: antalgic                                                                                                                                TODAY'S TREATMENT  DATE: 09/30/2023 Bike L3 x6 min with PT present to discuss status Rocker board x 2 min Gastroc stretch bilat 3 x 30 sec Soleus stretch 3 x 30 sec (left) BOSU push offs with left x 10 fwd and x 10 lateral BOSU push off with right landing on left working to "stick the landing" x 10 fwd and x 10 lateral Lateral band walks with yellow loop x 5 laps of 10 feet Fast feet fwd/back x 30 sec Fast feet side to side x 30 sec Hopping fwd back x 30 sec Hopping side/side x 30 sec Seated clams x 20 with yellow loop Standing hip ER/abduction with yellow loop Bularian split squats 2 x 10 with 1 balance pole    DATE: 09/27/2023 Bike L3 x6 min with PT present to discuss status Supine hamstring stretch with strap 2x20 sec bilat Supine IT band stretch with strap 2x20 sec bilat Supine hip adductor stretch with strap 2x20 sec bilat Side lunge at barre with foot on slider x10 bilat Back lunge at barre with foot on slider x10  bilat Lower Extremity Functional Score: 77 / 80 = 96.3 % Hip hiking from 2" step 2x10 bilat Standing balance on wobble board x1 min 3D ball toss on rebounder with blue plyoball single leg stance x20 each direction Curtsey lunge x10 bilat   DATE: 09/23/2023 Bike L3 x6 min with PT present to discuss status Single leg Calf Press (  seat at 7) 50# x10, 55# x10 left LE Side lunge with foot on slider x10 bilat Back lunge with foot on slider x10 bilat Bear plank 2x10 sec Curtsey lunge x10 bilat Step down from 6" step with UE support x10 bilat 3D ball toss on rebounder with blue plyoball single leg stance x20 each direction Retrogait on treadmill -0.9 mph x3 min Forward hop/stride down and back 3x10 ft Skipping down and back 3x10 ft Hip hiking from 2" step 2x10 bilat Supine hamstring stretch with strap x20 sec bilat Supine IT band stretch with strap x20 sec bilat Supine hip adductor stretch with strap x20 sec bilat  PATIENT EDUCATION:  Education details: Initiated HEP Person educated: Patient Education method: Programmer, multimedia, Facilities manager, and Handouts Education comprehension: verbalized understanding and returned demonstration  HOME EXERCISE PROGRAM: Access Code: [email or web address] URL: [email or web address] Date: 09/23/2023 Prepared by: Clydie Braun Menke  Exercises - Gastroc Stretch on Wall  - 1 x daily - 7 x weekly - 2 reps - 30 sec hold - Soleus Stretch on Wall  - 1 x daily - 7 x weekly - 2 reps - 30 sec hold - Single Leg Stance with Support  - 1 x daily - 7 x weekly - 2 reps - 20 sec hold - Standing Heel Raises  - 1 x daily - 7 x weekly - 2 sets - 3 reps - 20 sec hold - Eccentric Heel Lowering on Step  - 1 x daily - 7 x weekly - 10 reps - Crossover Lunge  - 1 x daily - 7 x weekly - 2 sets - 10 reps - Supine Hamstring Stretch with Strap  - 1 x daily - 7 x weekly - 2 reps - 20 sec hold - Supine ITB Stretch with Strap  - 1 x daily - 7 x weekly - 2 reps - 20 sec hold - Hip Adductors  and Hamstring Stretch with Strap  - 1 x daily - 7 x weekly - 2 reps - 20 sec hold  ASSESSMENT:  CLINICAL IMPRESSION: Mr Barreira was able to tolerate some plyometric activity without any increased pain today.  His hips continue to be an issue for stability but he is gaining strength and is compliant with his HEP.  He has one visit left.  He will be playing in a regular tennis match  this weekend.  Patient is on track for discharge at end of cert, as anticipated unless any issues arise this weekend.    OBJECTIVE IMPAIRMENTS: difficulty walking, decreased ROM, decreased strength, increased fascial restrictions, increased muscle spasms, impaired flexibility, postural dysfunction, and pain.   ACTIVITY LIMITATIONS: squatting, stairs, and transfers  PARTICIPATION LIMITATIONS: cleaning, driving, shopping, community activity, occupation, and yard work  PERSONAL FACTORS: Profession and Time since onset of injury/illness/exacerbation are also affecting patient's functional outcome.   REHAB POTENTIAL: Good  CLINICAL DECISION MAKING: Stable/uncomplicated  EVALUATION COMPLEXITY: Low   GOALS: Goals reviewed with patient? Yes  SHORT TERM GOALS: Target date: 09/06/2023   Be independent in initial HEP Baseline: Goal status: Met on 08/23/2023  2.  Be able to discontinue boot Baseline:  Goal status: Met  LONG TERM GOALS: Target date: 10/04/2023   Be independent in advanced HEP Baseline:  Goal status: Ongoing  2.  Improve LEFS by 3-5 points  Baseline:  Goal status: Met on 09/27/2023  3.  Be able to resume tennis and golf Baseline:  Goal status: Ongoing  4.  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  Be able to do single leg heel raise on left x 5 Baseline:  Goal status: Ongoing    PLAN:  PT FREQUENCY: 2x/week  PT DURATION: 8 weeks  PLANNED INTERVENTIONS: 97110-Therapeutic  exercises, 97530- Therapeutic activity, [id number]- Neuromuscular re-education, 97535- Self Care, 16109- Manual therapy, [id number]- Gait training, [phone]- Canalith repositioning, [id number]- Aquatic Therapy, 97014- Electrical stimulation (unattended), [id number]- Electrical stimulation (manual), 97016- Vasopneumatic device, [id number]- Ionotophoresis 4mg /ml Dexamethasone, Patient/Family education, Balance training, Stair training, Taping, Dry Needling, Joint mobilization, Cryotherapy, Moist heat, and Biofeedback  PLAN FOR NEXT SESSION DC assessment and DC unless symptoms change.     Victorino Dike B. Zyquan Crotty, PT 09/30/23 8:46 AM [facility] [address], [address] [place], [place] [address] Phone # [phone] Fax [phone]

## 2023-10-04 ENCOUNTER — Encounter: Payer: Self-pay | Admitting: Physical Therapy

## 2023-10-04 ENCOUNTER — Ambulatory Visit: Payer: 59

## 2023-10-04 DIAGNOSIS — M6281 Muscle weakness (generalized): Secondary | ICD-10-CM

## 2023-10-04 DIAGNOSIS — M25572 Pain in left ankle and joints of left foot: Secondary | ICD-10-CM

## 2023-10-04 DIAGNOSIS — R262 Difficulty in walking, not elsewhere classified: Secondary | ICD-10-CM

## 2023-10-04 DIAGNOSIS — R252 Cramp and spasm: Secondary | ICD-10-CM

## 2023-10-04 DIAGNOSIS — R293 Abnormal posture: Secondary | ICD-10-CM

## 2023-10-04 NOTE — Therapy (Signed)
 OUTPATIENT PHYSICAL THERAPY TREATMENT NOTE PHYSICAL THERAPY DISCHARGE SUMMARY  Visits from Start of Care: 16  Current functional level related to goals / functional outcomes: See below   Remaining deficits: See below   Education / Equipment: See below   Patient agrees to discharge. Patient goals were met. Patient is being discharged due to meeting the stated rehab goals.    Patient Name: Thomas Ballard MRN: [id number] DOB:[birth year], 63 y.o., male Today's Date: 10/04/2023  END OF SESSION:  PT End of Session - 10/04/23 0809     Visit Number 16    Date for PT Re-Evaluation 10/04/23    Authorization Type U[employer]    Progress Note Due on Visit 20    PT Start Time 0800    PT Stop Time 0844    PT Time Calculation (min) 44 min    Activity Tolerance Patient tolerated treatment well    Behavior During Therapy WFL for tasks assessed/performed             Past Medical History:  Diagnosis Date   DM (diabetes mellitus), type 2 with complications (HCC)    Gout    Hyperlipidemia    Hypertension    Hypothyroidism    OSA (obstructive sleep apnea)    PSVT (paroxysmal supraventricular tachycardia) (HCC)    Sleep apnea    Past Surgical History:  Procedure Laterality Date   BACK SURGERY  1983   There are no active problems to display for this patient.   PCP: Peri Maris, FNP  REFERRING PROVIDER: Sharl Ma, DPM  REFERRING DIAG:  [phone] (ICD-10-CM) - Gastrocnemius equinus of left lower extremity  THERAPY DIAG:  Pain in left ankle and joints of left foot  Difficulty in walking, not elsewhere classified  Muscle weakness (generalized)  Cramp and spasm  Abnormal posture  Rationale for Evaluation and Treatment: Rehabilitation  ONSET DATE: 08/08/2023  SUBJECTIVE:   SUBJECTIVE STATEMENT: Patient states that he was able to play tennis over the weekend but did get a twinge of pain after going for an overhead backhand.  He was fairly sore after this but went  to the gym the next day and has been doing fine since that time.  Agrees to DC.   Goes by Thomas Ballard PERTINENT HISTORY: From initial MD note:  The patient, with a history of diabetes and previous back surgery, presents with a long-standing sensation in the left foot, described as 'bubble wrap,' which has now extended to the right foot. This sensation has been present for approximately five to six years. The patient also reports a feeling of 'gout' in the right big toe, without the severe pain typically associated with gout. The patient experiences intermittent 'weird' feelings when dragging the left foot on bed sheets, which do not persist. The patient is concerned about the progression of these symptoms and is seeking ways to prevent further deterioration.   PAIN:   10/04/23 Are you having pain? Yes: NPRS scale: currently  1/10 Pain location: left achilles Pain description: tightness Aggravating factors: tennis, excessive walking (has to fly for work and having to walk rapidly through airport) Relieving factors: Rest, ice  PRECAUTIONS: None  RED FLAGS: None   WEIGHT BEARING RESTRICTIONS: No  FALLS:  Has patient fallen in last 6 months? No  LIVING ENVIRONMENT: Lives with: lives with their spouse Lives in: House/apartment  OCCUPATION: Sales rep, mostly desk, some traveling  PLOF: Independent, Independent with basic ADLs, Independent with household mobility without device, Independent with community mobility without  device, Independent with homemaking with ambulation, Independent with gait, and Independent with transfers  PATIENT GOALS: He hopes to be able to recover fully and return to golf and tennis.   NEXT MD VISIT: Dr Lilian Kapur on 09/29/2023  OBJECTIVE:  Note: Objective measures were completed at Evaluation unless otherwise noted.  DIAGNOSTIC FINDINGS:  MRI of left heel on 08/17/2023: IMPRESSION: Partial high-grade non full-thickness tear of the Achilles tendon 3.4 cm from the  insertion.  PATIENT SURVEYS:  08/16/2023:  LEFS  56 / 80 = 70.0 % 09/02/2023:  Lower Extremity Functional Score: 58 / 80 = 72.5 % 09/27/2023:  Lower Extremity Functional Score: 77 / 80 = 96.3 %  COGNITION: Overall cognitive status: Within functional limits for tasks assessed     SENSATION: WFL   POSTURE:  left foot pronated > right  PALPATION: Non tender and medial and lateral stabilizers, minimal tenderness over left achilles  LOWER EXTREMITY ROM:  Active ROM Right eval Left eval Left 09/02/23 Left  10/04/23  Ankle dorsiflexion 18 14 14 16   Ankle plantarflexion 52 42 64 65  Ankle inversion 20 18 35 18  Ankle eversion 12 22 8 12    (Blank rows = not tested)  LOWER EXTREMITY MMT:  All generally 5/5 with exception of left ankle eversion 4+/5  10/04/23: all 5/5  LOWER EXTREMITY SPECIAL TESTS:  Ankle special tests: Thompson's test: negative  FUNCTIONAL TESTS:  08/16/2023 5 times sit to stand: 11.22 sec Timed up and go (TUG): 7.41 sec Single leg stance:  right- 21.54 sec, left- 4.31 sec  09/20/2023   10/04/23: 5 times sit to stand: 5.63 sec Timed up and go (TUG): 4.22 sec Single leg stance:  right- 30.00 sec max, left- 30.00 sec max (still standing at 30 sec but stopped timer due to no lob)  GAIT: Distance walked: 30 feet Assistive device utilized: None Level of assistance: Complete Independence Comments: antalgic                                                                                                                                TODAY'S TREATMENT  DATE: 10/04/2023 Bike L3 x6 min with PT present to discuss status Functional tests completed [place] split squats 2 x 10 each LE Lateral band walks with red loop x 3 laps of 15 Hip matrix hip extension and abduction 2 x 10 with with 55 lbs Re-assessment and measures  Reviewed HEP  DATE: 09/30/2023 Bike L3 x6 min with PT present to discuss status Rocker board x 2 min Gastroc stretch bilat 3 x 30  sec Soleus stretch 3 x 30 sec (left) BOSU push offs with left x 10 fwd and x 10 lateral BOSU push off with right landing on left working to "stick the landing" x 10 fwd and x 10 lateral Lateral band walks with yellow loop x 5 laps of 10 feet Fast feet fwd/back x 30 sec Fast feet side to side x 30  sec Hopping fwd back x 30 sec Hopping side/side x 30 sec Seated clams x 20 with yellow loop Standing hip ER/abduction with yellow loop Bularian split squats 2 x 10 with 1 balance pole    DATE: 09/27/2023 Bike L3 x6 min with PT present to discuss status Supine hamstring stretch with strap 2x20 sec bilat Supine IT band stretch with strap 2x20 sec bilat Supine hip adductor stretch with strap 2x20 sec bilat Side lunge at barre with foot on slider x10 bilat Back lunge at barre with foot on slider x10 bilat Lower Extremity Functional Score: 77 / 80 = 96.3 % Hip hiking from 2" step 2x10 bilat Standing balance on wobble board x1 min 3D ball toss on rebounder with blue plyoball single leg stance x20 each direction Curtsey lunge x10 bilat   PATIENT EDUCATION:  Education details: Initiated HEP Person educated: Patient Education method: Programmer, multimedia, Facilities manager, and Handouts Education comprehension: verbalized understanding and returned demonstration  HOME EXERCISE PROGRAM: Access Code: [email or web address] URL: [email or web address] Date: 10/04/2023 Prepared by: Mikey Kirschner  Exercises - Gastroc Stretch on Wall  - 1 x daily - 7 x weekly - 2 reps - 30 sec hold - Soleus Stretch on Wall  - 1 x daily - 7 x weekly - 2 reps - 30 sec hold - Single Leg Stance with Support  - 1 x daily - 7 x weekly - 2 reps - 20 sec hold - Standing Heel Raises  - 1 x daily - 7 x weekly - 2 sets - 3 reps - 20 sec hold - Eccentric Heel Lowering on Step  - 1 x daily - 7 x weekly - 10 reps - Crossover Lunge  - 1 x daily - 7 x weekly - 2 sets - 10 reps - Supine Hamstring Stretch with Strap  - 1 x daily - 7 x  weekly - 2 reps - 20 sec hold - Supine ITB Stretch with Strap  - 1 x daily - 7 x weekly - 2 reps - 20 sec hold - Hip Adductors and Hamstring Stretch with Strap  - 1 x daily - 7 x weekly - 2 reps - 20 sec hold - Side Stepping with Resistance at Ankles  - 1 x daily - 7 x weekly - 3 sets - 10 reps - Standing Quad Stretch in [place] Split Squat Position  - 1 x daily - 7 x weekly - 3 sets - 10 reps - Single Leg Cone Touch  - 1 x daily - 7 x weekly - 3 sets - 10 reps  ASSESSMENT:  CLINICAL IMPRESSION: Mr Wendorff has met all goals.  He is gradually resuming tennis and golf.  He played a somewhat competitive round over the weekend along with traveling and did fairly well, only experiencing some soreness after tennis.  He is well motivated and compliant.  He should continue to do well.  We will DC at this time.    OBJECTIVE IMPAIRMENTS: difficulty walking, decreased ROM, decreased strength, increased fascial restrictions, increased muscle spasms, impaired flexibility, postural dysfunction, and pain.   ACTIVITY LIMITATIONS: squatting, stairs, and transfers  PARTICIPATION LIMITATIONS: cleaning, driving, shopping, community activity, occupation, and yard work  PERSONAL FACTORS: Profession and Time since onset of injury/illness/exacerbation are also affecting patient's functional outcome.   REHAB POTENTIAL: Good  CLINICAL DECISION MAKING: Stable/uncomplicated  EVALUATION COMPLEXITY: Low   GOALS: Goals reviewed with patient? Yes  SHORT TERM GOALS: Target date: 09/06/2023   Be independent in initial HEP Baseline: Goal status: Met  on 08/23/2023  2.  Be able to discontinue boot Baseline:  Goal status: Met  LONG TERM GOALS: Target date: 10/04/2023   Be independent in advanced HEP Baseline:  Goal status: MET  2.  Improve LEFS by 3-5 points  Baseline:  Goal status: Met on 09/27/2023  3.  Be able to resume tennis and golf Baseline:  Goal status: MET 10/04/23  4.  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  Be able to do single leg heel raise on left x 5 Baseline:  Goal status: MET    PLAN:  PT FREQUENCY: 2x/week  PT DURATION: 8 weeks  PLANNED INTERVENTIONS: 97110-Therapeutic exercises, 97530- Therapeutic activity, [id number]- Neuromuscular re-education, 97535- Self Care, 16109- Manual therapy, [id number]- Gait training, [phone]- Canalith repositioning, [id number]- Aquatic Therapy, 97014- Electrical stimulation (unattended), [id number]- Electrical stimulation (manual), 97016- Vasopneumatic device, [id number]- Ionotophoresis 4mg /ml Dexamethasone, Patient/Family education, Balance training, Stair training, Taping, Dry Needling, Joint mobilization, Cryotherapy, Moist heat, and Biofeedback  PLAN FOR NEXT SESSION DC   Manveer Gomes B. Lennix Rotundo, PT 10/04/23 8:50 AM [facility] [address], [address] [place], [place] [address] Phone # [phone] Fax [phone]

## 2024-01-25 ENCOUNTER — Other Ambulatory Visit: Payer: Self-pay | Admitting: Podiatry

## 2024-01-26 ENCOUNTER — Encounter: Payer: Self-pay | Admitting: Podiatry

## 2024-01-30 MED ORDER — MELOXICAM 15 MG PO TABS: 15.0000 mg | ORAL_TABLET | Freq: Every day | ORAL | 0 refills | Status: DC

## 2024-04-27 ENCOUNTER — Other Ambulatory Visit: Payer: Self-pay | Admitting: Podiatry
